# Patient Record
Sex: Female | Born: 1995 | Race: White | Hispanic: No | Marital: Single | Smoking: Current every day smoker
Health system: Southern US, Academic
[De-identification: ages and names within clinical notes are randomized; demographics above are authoritative.]

## PROBLEM LIST (undated history)

## (undated) DIAGNOSIS — D649 Anemia, unspecified: Secondary | ICD-10-CM

## (undated) HISTORY — PX: HX ADENOIDECTOMY: SHX29

## (undated) HISTORY — PX: HX TONSILLECTOMY: SHX27

---
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
## 2015-07-15 ENCOUNTER — Inpatient Hospital Stay (EMERGENCY_DEPARTMENT_HOSPITAL)
Admission: EM | Admit: 2015-07-15 | Discharge: 2015-07-15 | Disposition: A | Discharge status: Home / Self Care | Payer: Self-pay

## 2015-07-15 DIAGNOSIS — R1110 Vomiting, unspecified: Secondary | ICD-10-CM

## 2015-07-15 DIAGNOSIS — K529 Noninfective gastroenteritis and colitis, unspecified: Secondary | ICD-10-CM

## 2015-12-09 ENCOUNTER — Emergency Department (HOSPITAL_COMMUNITY)
Admission: EM | Admit: 2015-12-09 | Discharge: 2015-12-10 | Disposition: A | Discharge status: Home / Self Care | Payer: Self-pay | Attending: Emergency Medicine | Admitting: Emergency Medicine

## 2015-12-09 ENCOUNTER — Encounter (HOSPITAL_COMMUNITY): Payer: Self-pay

## 2015-12-09 DIAGNOSIS — Z3A01 Less than 8 weeks gestation of pregnancy: Secondary | ICD-10-CM | POA: Insufficient documentation

## 2015-12-09 DIAGNOSIS — Z87891 Personal history of nicotine dependence: Secondary | ICD-10-CM | POA: Insufficient documentation

## 2015-12-09 DIAGNOSIS — O209 Hemorrhage in early pregnancy, unspecified: Secondary | ICD-10-CM

## 2015-12-09 DIAGNOSIS — R58 Hemorrhage, not elsewhere classified: Secondary | ICD-10-CM

## 2015-12-09 DIAGNOSIS — O26851 Spotting complicating pregnancy, first trimester: Secondary | ICD-10-CM | POA: Insufficient documentation

## 2015-12-09 DIAGNOSIS — O200 Threatened abortion: Secondary | ICD-10-CM

## 2015-12-09 HISTORY — DX: Anemia, unspecified: D64.9

## 2015-12-09 LAB — POC URINE PREG, ED: PREG TEST UR: POSITIVE — AB

## 2015-12-09 NOTE — ED Provider Notes (Signed)
[facility]-EMERGENCY DEPT Provider Note   CSN: [id number] Arrival date & time: 12/09/15  1949  By signing my name below, I, Emmanuella Mensah, attest that this documentation has been prepared under the direction and in the presence of Shon Baton, MD. Electronically Signed: Angelene Giovanni, ED Scribe. 12/09/15. 11:31 PM.    History   Chief Complaint Chief Complaint  Patient presents with  . Abdominal Pain  . Other    [redacted] weeks pregnant   HPI Comments: Katelyn Rivas is a 20 y.o. female who is [redacted] weeks pregnant who presents to the Emergency Department complaining of gradually worsening moderate vaginal bleeding onset 3 days ago. She reports associated upper abdominal cramping onset today. No alleviating factors noted. Pt has not tried any medications PTA. She reports that this is her 3rd pregnancy and that these episodes are consistent with her menstrual bleeding, just slightly heavier. Her LNMP was October 27, 2015. She denies any n/v/d, urinary symptoms, fever, or chills. G3P2.  No current OB GYN, pt recently moved to town.   The history is provided by the patient. No language interpreter was used.    Past Medical History:  Diagnosis Date  . Anemia     There are no active problems to display for this patient.   History reviewed. No pertinent surgical history.  OB History    Gravida Para Term Preterm AB Living   1             SAB TAB Ectopic Multiple Live Births                   Home Medications    Prior to Admission medications   Not on File    Family History History reviewed. No pertinent family history.  Social History Social History  Substance Use Topics  . Smoking status: Former Smoker    Quit date: 11/29/2015  . Smokeless tobacco: Never Used  . Alcohol use No     Allergies   Review of patient's allergies indicates no known allergies.   Review of Systems Review of Systems  Constitutional: Negative for chills and fever.  Gastrointestinal:  Positive for abdominal pain. Negative for diarrhea, nausea and vomiting.  Genitourinary: Positive for vaginal bleeding. Negative for dysuria and frequency.  All other systems reviewed and are negative.    Physical Exam Updated Vital Signs BP 101/64   Pulse 72   Temp 99.2 F (37.3 C) (Oral)   Resp 12   SpO2 100%   Physical Exam  Constitutional: She is oriented to person, place, and time. She appears well-developed and well-nourished. No distress.  HENT:  Head: Normocephalic and atraumatic.  Cardiovascular: Normal rate, regular rhythm and normal heart sounds.   Pulmonary/Chest: Effort normal and breath sounds normal. No respiratory distress. She has no wheezes.  Abdominal: Soft. Bowel sounds are normal. She exhibits no mass. There is no tenderness. There is no guarding.  Genitourinary:  Genitourinary Comments: Scant blood in the vaginal vault, no active bleeding noted, no clots noted, cervical os is closed, no adnexal tenderness  Neurological: She is alert and oriented to person, place, and time.  Skin: Skin is warm and dry.  Psychiatric: She has a normal mood and affect.  Nursing note and vitals reviewed.    ED Treatments / Results  DIAGNOSTIC STUDIES: Oxygen Saturation is 98% on RA, normal by my interpretation.    COORDINATION OF CARE: 11:27 PM- Pt advised of plan for treatment and pt agrees. Pt will receive lab  work for further evaluation.    Labs (all labs ordered are listed, but only abnormal results are displayed) Labs Reviewed  WET PREP, GENITAL - Abnormal; Notable for the following:       Result Value   Clue Cells Wet Prep HPF POC PRESENT (*)    WBC, Wet Prep HPF POC FEW (*)    All other components within normal limits  URINALYSIS, ROUTINE W REFLEX MICROSCOPIC (NOT AT [facility]) - Abnormal; Notable for the following:    APPearance TURBID (*)    Specific Gravity, Urine 1.033 (*)    Hgb urine dipstick LARGE (*)    Bilirubin Urine SMALL (*)    Ketones, ur 15 (*)     All other components within normal limits  URINE MICROSCOPIC-ADD ON - Abnormal; Notable for the following:    Squamous Epithelial / LPF 0-5 (*)    Bacteria, UA RARE (*)    All other components within normal limits  I-STAT BETA HCG BLOOD, ED ([facility], [facility], [facility] ONLY) - Abnormal; Notable for the following:    I-stat hCG, quantitative >2,000.0 (*)    All other components within normal limits  POC URINE PREG, ED - Abnormal; Notable for the following:    Preg Test, Ur POSITIVE (*)    All other components within normal limits  TYPE AND SCREEN  ABO/RH  GC/CHLAMYDIA PROBE AMP ([place]) NOT AT [facility]    EKG  EKG Interpretation None       Radiology [place] Ob Comp Less 14 Wks  Result Date: 12/10/2015 CLINICAL DATA:  Pregnant patient in first-trimester pregnancy with vaginal bleeding for 3 days. EXAM: OBSTETRIC <14 WK US AND TRANSVAGINAL OB US TECHNIQUE: Both transabdominal and transvaginal ultrasound examinations were performed for complete evaluation of the gestation as well as the maternal uterus, adnexal regions, and pelvic cul-de-sac. Transvaginal technique was performed to assess early pregnancy. COMPARISON:  None. FINDINGS: Intrauterine gestational sac: Single Yolk sac:  Not visualized. Embryo:  Not visualized. MSD: 10  mm   5 w   5  d Subchorionic hemorrhage:  None visualized. Maternal uterus/adnexae: The left ovary measures 2.7 x 1.9 x 2.6 cm and is unremarkable. Right ovary measures 2.2 x 2.3 x 1.7 cm and is unremarkable. No pelvic free fluid. IMPRESSION: Probable early intrauterine gestational sac, but no yolk sac, fetal pole, or cardiac activity yet visualized. Recommend follow-up quantitative B-HCG levels and follow-up US in 14 days to confirm and assess viability. This recommendation follows SRU consensus guidelines: Diagnostic Criteria for Nonviable Pregnancy Early in the First Trimester. Malva Limes Engl J Med 2013; 409:8119-14; 369:1443-51. Electronically Signed   By: Rubye OaksMelanie  Ehinger M.D.   On: 12/10/2015 01:55    [place] Ob Transvaginal  Result Date: 12/10/2015 CLINICAL DATA:  Pregnant patient in first-trimester pregnancy with vaginal bleeding for 3 days. EXAM: OBSTETRIC <14 WK US AND TRANSVAGINAL OB US TECHNIQUE: Both transabdominal and transvaginal ultrasound examinations were performed for complete evaluation of the gestation as well as the maternal uterus, adnexal regions, and pelvic cul-de-sac. Transvaginal technique was performed to assess early pregnancy. COMPARISON:  None. FINDINGS: Intrauterine gestational sac: Single Yolk sac:  Not visualized. Embryo:  Not visualized. MSD: 10  mm   5 w   5  d Subchorionic hemorrhage:  None visualized. Maternal uterus/adnexae: The left ovary measures 2.7 x 1.9 x 2.6 cm and is unremarkable. Right ovary measures 2.2 x 2.3 x 1.7 cm and is unremarkable. No pelvic free fluid. IMPRESSION: Probable early intrauterine gestational sac, but no yolk sac,  fetal pole, or cardiac activity yet visualized. Recommend follow-up quantitative B-HCG levels and follow-up US in 14 days to confirm and assess viability. This recommendation follows SRU consensus guidelines: Diagnostic Criteria for Nonviable Pregnancy Early in the First Trimester. Malva Limes Engl J Med 2013; 161:0960-45; 369:1443-51. Electronically Signed   By: Rubye OaksMelanie  Ehinger M.D.   On: 12/10/2015 01:55    Procedures Procedures (including critical care time)  Medications Ordered in ED Medications  HYDROcodone-acetaminophen (NORCO/VICODIN) 5-325 MG per tablet 1 tablet (1 tablet Oral Given 12/10/15 0058)     Initial Impression / Assessment and Plan / ED Course  Shon Batonourtney F Tanasha Menees, MD has reviewed the triage vital signs and the nursing notes.  Pertinent labs & imaging results that were available during my care of the patient were reviewed by me and considered in my medical decision making (see chart for details).  Clinical Course    Patient presents with bleeding during early pregnancy. Cervical os is closed. She is otherwise nontoxic. Ultrasound  obtained and shows a gestational sac suggestive of early pregnancy 5 weeks and 5 days. Based on last menstrual period she would be 6 weeks and 2 days. Discussed with the patient that given bleeding, would diagnose her with a threatened miscarriage. She needs a repeat ultrasound and beta hCG in 2 weeks to assess for viability of pregnancy. She was given bleeding precautions and follow-up in women's clinic.  After history, exam, and medical workup I feel the patient has been appropriately medically screened and is safe for discharge home. Pertinent diagnoses were discussed with the patient. Patient was given return precautions.   Final Clinical Impressions(s) / ED Diagnoses   Final diagnoses:  Bleeding in early pregnancy  Threatened miscarriage    New Prescriptions New Prescriptions   No medications on file   I personally performed the services described in this documentation, which was scribed in my presence. The recorded information has been reviewed and is accurate.    Shon Batonourtney F Kiri Hinderliter, MD 12/10/15 [phone]

## 2015-12-09 NOTE — ED Triage Notes (Signed)
Pt states is [redacted]weeks pregnant. Began bleeding 3 days ago. Pt states marked increase in bleeding today. States ~ 1 pad/hr. 10 pads total. Pt also complaining of upper abdominal pain/cramping.

## 2015-12-09 NOTE — ED Notes (Signed)
Patient ambulatory to restroom with steady gait. Denies dizziness/lighheadedness.

## 2015-12-09 NOTE — ED Notes (Signed)
Pelvic cart set up at bedside  

## 2015-12-10 ENCOUNTER — Emergency Department (HOSPITAL_COMMUNITY): Payer: Self-pay

## 2015-12-10 LAB — TYPE AND SCREEN
ABO/RH(D): O POS
ANTIBODY SCREEN: NEGATIVE

## 2015-12-10 LAB — URINALYSIS, ROUTINE W REFLEX MICROSCOPIC
GLUCOSE, UA: NEGATIVE mg/dL
Ketones, ur: 15 mg/dL — AB
LEUKOCYTES UA: NEGATIVE
NITRITE: NEGATIVE
PH: 5.5 (ref 5.0–8.0)
Protein, ur: NEGATIVE mg/dL
Specific Gravity, Urine: 1.033 — ABNORMAL HIGH (ref 1.005–1.030)

## 2015-12-10 LAB — WET PREP, GENITAL
Sperm: NONE SEEN
Trich, Wet Prep: NONE SEEN
Yeast Wet Prep HPF POC: NONE SEEN

## 2015-12-10 LAB — GC/CHLAMYDIA PROBE AMP (~~LOC~~) NOT AT ARMC
CHLAMYDIA, DNA PROBE: NEGATIVE
Neisseria Gonorrhea: NEGATIVE

## 2015-12-10 LAB — URINE MICROSCOPIC-ADD ON: WBC UA: NONE SEEN WBC/hpf (ref 0–5)

## 2015-12-10 LAB — I-STAT BETA HCG BLOOD, ED (MC, WL, AP ONLY)

## 2015-12-10 LAB — ABO/RH: ABO/RH(D): O POS

## 2015-12-10 MED ORDER — HYDROCODONE-ACETAMINOPHEN 5-325 MG PO TABS
1.0000 | ORAL_TABLET | Freq: Once | ORAL | Status: AC
  Administered 2015-12-10: 1 via ORAL
  Filled 2015-12-10: qty 1

## 2015-12-10 NOTE — ED Notes (Signed)
Patient transported to US 

## 2015-12-10 NOTE — ED Notes (Signed)
Patient verbalized understanding of discharge instructions and denies any further needs or questions at this time. VS stable. Patient ambulatory with steady gait.  

## 2016-04-27 LAB — RUBELLA VIRUS IGG AB: RUBELLA IGG: IMMUNE

## 2016-04-27 LAB — HIV1/HIV2 SCREEN, COMBINED ANTIGEN AND ANTIBODY: HIV SCREEN, COMBINED ANTIGEN & ANTIBODY: NEGATIVE

## 2016-04-27 LAB — SERUM SYPHILIS TEST: RPR: NONREACTIVE

## 2016-04-27 LAB — HEPATITIS B SURFACE ANTIGEN: HEPATITIS B SURFACE AG: NEGATIVE

## 2016-09-24 IMAGING — US US OB TRANSVAGINAL
1 series · 14 of 28 positions shown · non-contrast
Comparison: None.

CLINICAL DATA: Pregnant patient in first-trimester pregnancy with
vaginal bleeding for 3 days.

EXAM:
OBSTETRIC <14 WK US AND TRANSVAGINAL OB US
TECHNIQUE: Both transabdominal and transvaginal ultrasound examinations were
performed for complete evaluation of the gestation as well as the
maternal uterus, adnexal regions, and pelvic cul-de-sac.
Transvaginal technique was performed to assess early pregnancy.

[Series 1: us ob transvaginal · 0.17mm/px · 45 acquisitions, 14 frames shown]
[im 2/45]
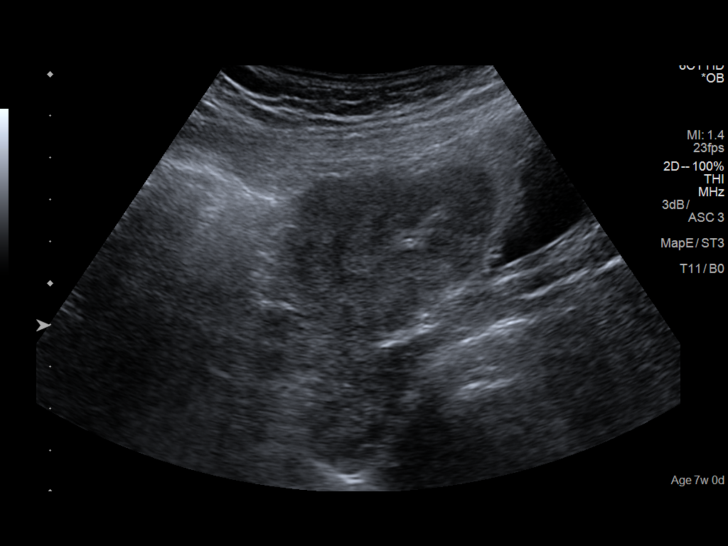
[im 5/45]
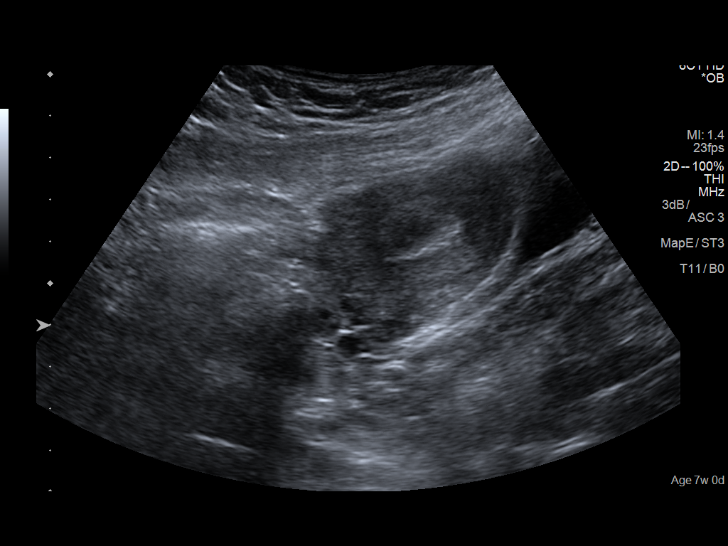
[im 9/45]
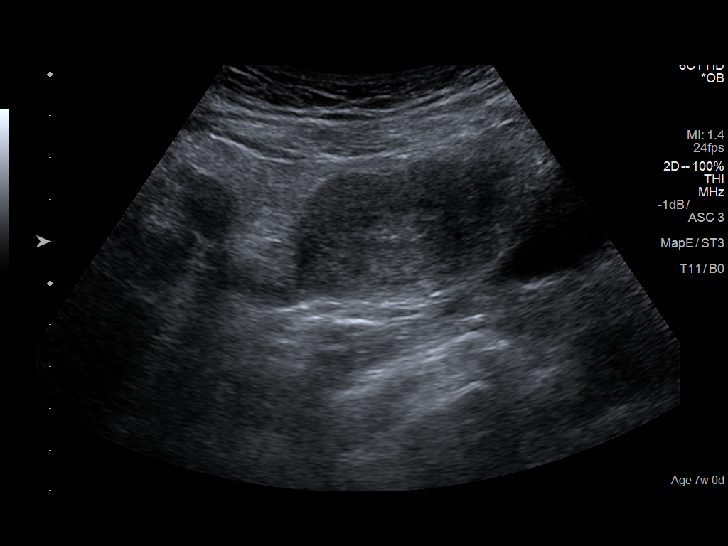
[im 12/45]
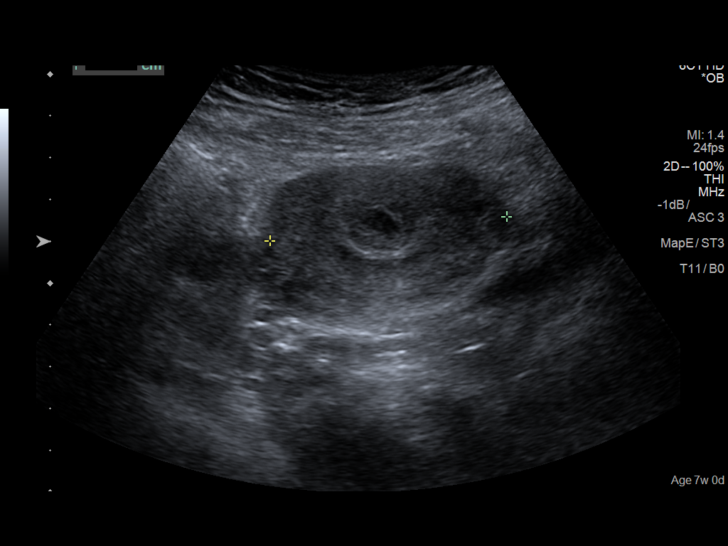
[im 15/45]
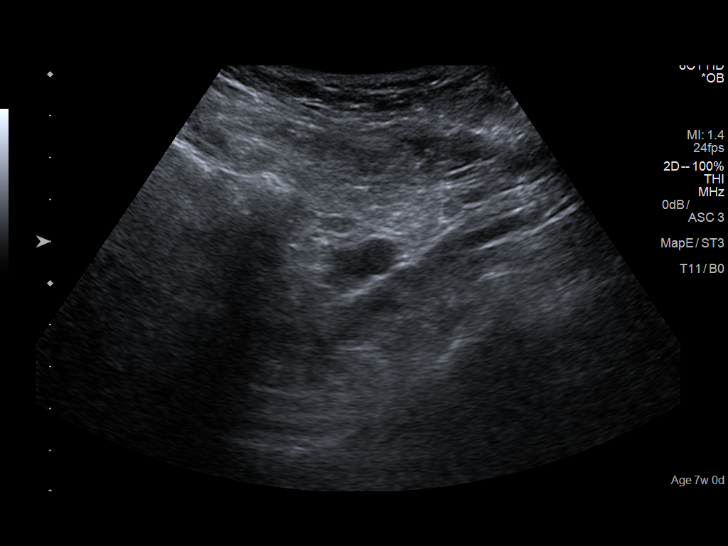
[im 18/45]
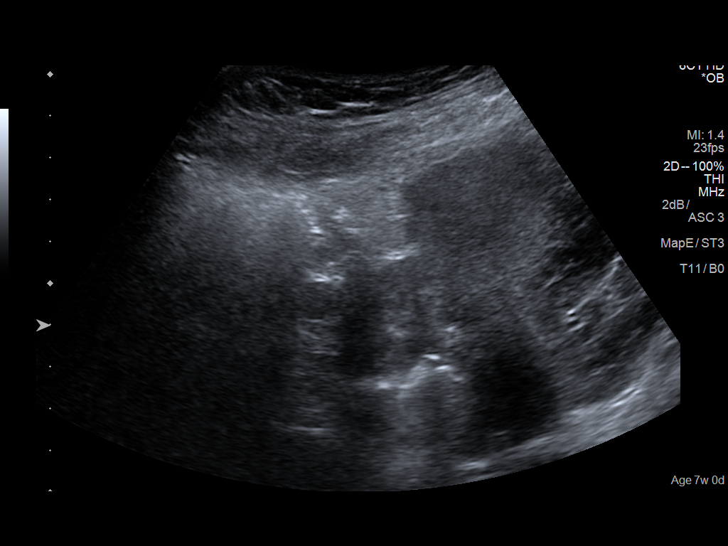
[im 22/45]
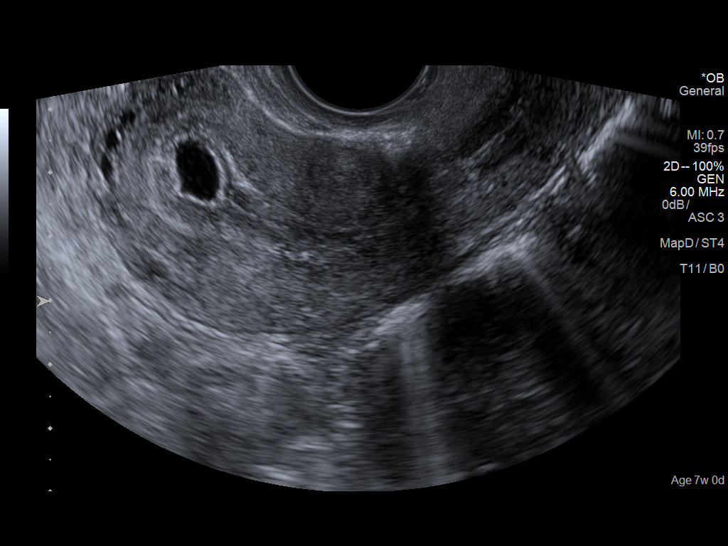
[im 25/45]
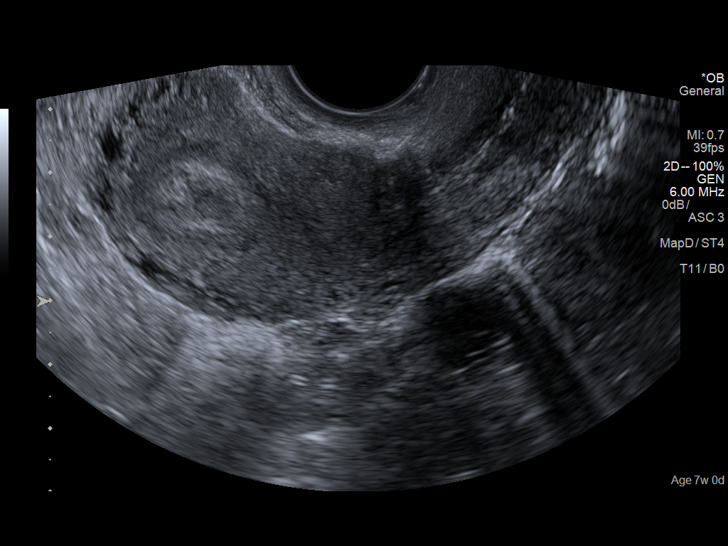
[im 28/45]
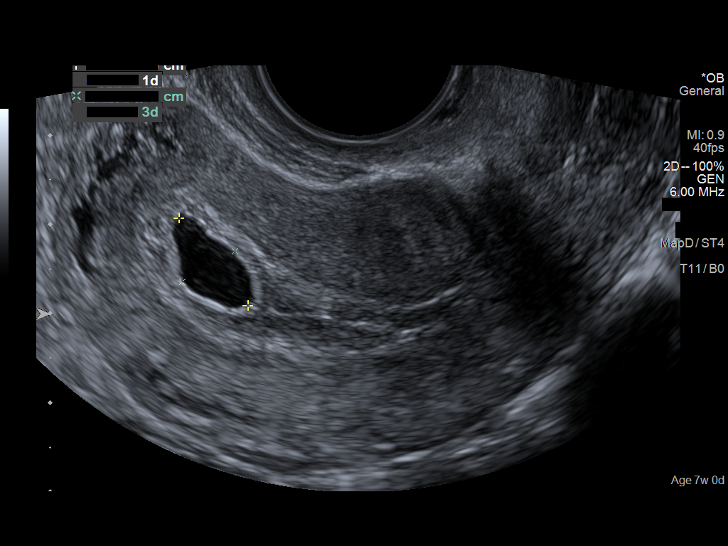
[im 31/45]
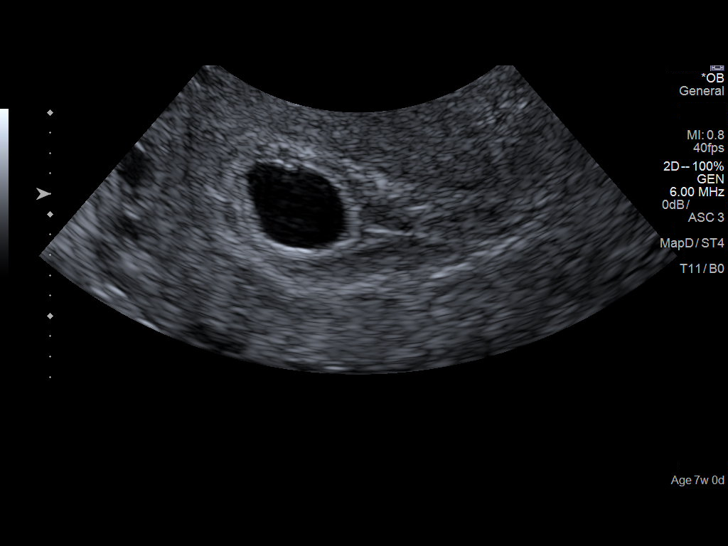
[im 35/45]
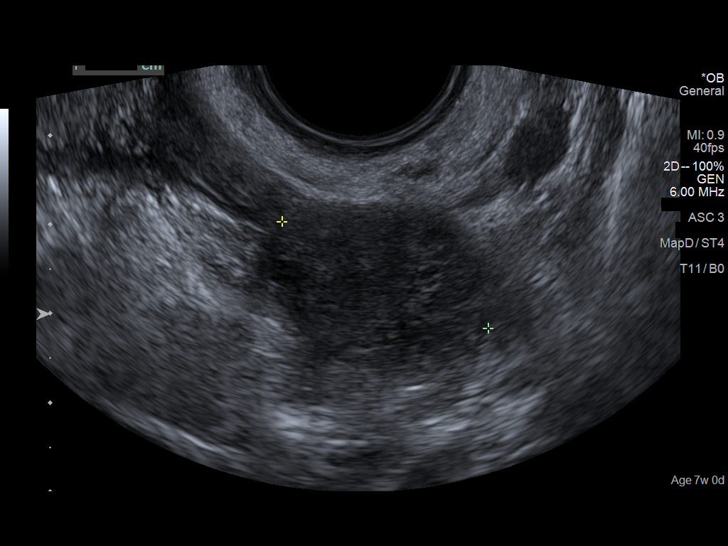
[im 38/45]
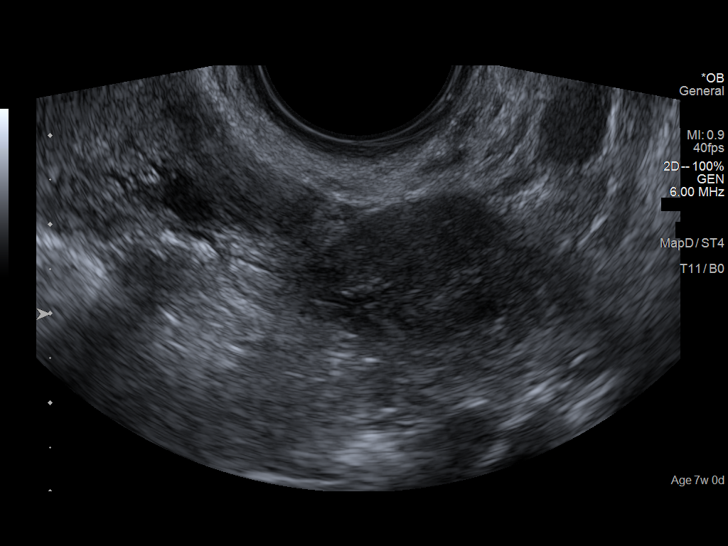
[im 41/45]
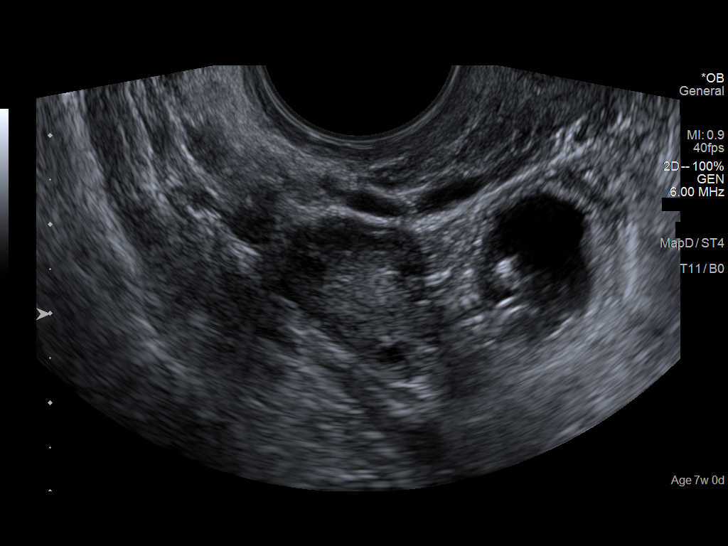
[im 45/45]
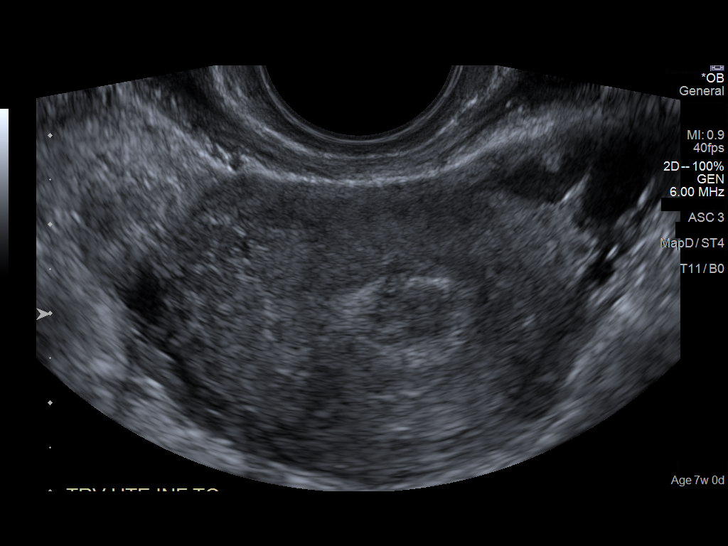

[14 of 28 positions shown; findings below may reference images not displayed]

FINDINGS: Intrauterine gestational sac: Single

Yolk sac:  Not visualized.

Embryo:  Not visualized.

MSD: 10  mm   5 w   5  d

Subchorionic hemorrhage:  None visualized.

Maternal uterus/adnexae: The left ovary measures 2.7 x 1.9 x 2.6 cm
and is unremarkable. Right ovary measures 2.2 x 2.3 x 1.7 cm and is
unremarkable. No pelvic free fluid.
IMPRESSION: Probable early intrauterine gestational sac, but no yolk sac, fetal
pole, or cardiac activity yet visualized. Recommend follow-up
quantitative B-HCG levels and follow-up US in 14 days to confirm and
assess viability. This recommendation follows SRU consensus
guidelines: Diagnostic Criteria for Nonviable Pregnancy Early in the
First Trimester. N Engl J Med 4857; [DATE].

## 2016-10-05 ENCOUNTER — Ambulatory Visit
Admission: RE | Admit: 2016-10-05 | Discharge: 2016-10-05 | Disposition: A | Discharge status: Home / Self Care | Payer: Medicaid Other | Attending: Specialist | Admitting: Specialist

## 2016-10-05 ENCOUNTER — Encounter (HOSPITAL_COMMUNITY): Payer: Self-pay

## 2016-10-05 ENCOUNTER — Ambulatory Visit (HOSPITAL_COMMUNITY): Payer: Self-pay | Admitting: Obstetrics & Gynecology

## 2016-10-05 DIAGNOSIS — Z3493 Encounter for supervision of normal pregnancy, unspecified, third trimester: Secondary | ICD-10-CM | POA: Insufficient documentation

## 2016-10-05 LAB — URINALYSIS, MACRO/MICRO
BILIRUBIN: NOT DETECTED mg/dL
BLOOD: NOT DETECTED mg/dL
GLUCOSE: NOT DETECTED mg/dL
KETONES: NOT DETECTED mg/dL
NITRITE: NOT DETECTED
PH: 7 (ref 5.0–8.0)
PROTEIN: NOT DETECTED mg/dL
SPECIFIC GRAVITY: 1.002 — ABNORMAL LOW (ref 1.005–1.030)
UROBILINOGEN: NOT DETECTED mg/dL

## 2016-10-05 LAB — URINALYSIS, MICROSCOPIC

## 2016-10-05 NOTE — Nurses Notes (Signed)
Patient discharged home with family.  AVS reviewed with patient/care giver.  A written copy of the AVS and discharge instructions was given to the patient/care giver.  Questions sufficiently answered as needed.  Patient/care giver encouraged to follow up with PCP as indicated.  In the event of an emergency, patient/care giver instructed to call 911 or go to the nearest emergency room.

## 2016-10-05 NOTE — Discharge Instructions (Signed)
Return to hospital if water breaks, decreased fetal movement, vaginal bleeding: may have some spotting after vaginal exam or intercourse.  More than 6 contractions in one hour until you ar [redacted] weeks along and then after 36 weeks if you are having contractions that are 5 minutes apart and becoming closer and stronger.  Severe headache unrelieved with Tylenol.  Blurred or double vision, spot or floaters in field of vision.  Drink 8-10 glasses of water daily.

## 2016-10-05 NOTE — Nurses Notes (Signed)
Pt to triage bed #3 from ER complaining of vaginal pressure since Saturday.  Denies any vaginal bleeding or leaking of fluid.  Confirms fetal movement.  Plan of care discussed, verbalized understanding and consent.  Change into gown.

## 2016-10-05 NOTE — Progress Notes (Signed)
Patient presents to labor and delivery triage with complaints of pelvic pressure.  She is approximately [redacted] weeks gestation.  Cervix is noted to be closed and thick.  Patient is scheduled for repeat C-section.  Nonstress test was reassuring with good accelerations and variability noted. Patient is scheduled to see her primary OB this week.  She was given instructions to return to the hospital if necessary or to keep her appointment as scheduled.

## 2016-10-07 LAB — URINE CULTURE,ROUTINE: URINE CULTURE: <10000

## 2016-11-09 ENCOUNTER — Inpatient Hospital Stay (HOSPITAL_COMMUNITY): Payer: Medicaid Other | Admitting: Certified Registered"

## 2016-11-09 ENCOUNTER — Encounter (HOSPITAL_COMMUNITY)
Admission: RE | Disposition: A | Discharge status: Home / Self Care | Payer: Self-pay | Source: Ambulatory Visit | Attending: Specialist

## 2016-11-09 ENCOUNTER — Inpatient Hospital Stay (HOSPITAL_COMMUNITY): Payer: Medicaid Other | Admitting: Specialist

## 2016-11-09 ENCOUNTER — Encounter (HOSPITAL_COMMUNITY): Payer: Self-pay

## 2016-11-09 ENCOUNTER — Other Ambulatory Visit (HOSPITAL_COMMUNITY): Payer: Self-pay | Admitting: Specialist

## 2016-11-09 ENCOUNTER — Inpatient Hospital Stay
Admission: RE | Admit: 2016-11-09 | Discharge: 2016-11-11 | DRG: 766 | Disposition: A | Discharge status: Home / Self Care | Payer: Medicaid Other | Source: Ambulatory Visit | Attending: Specialist | Admitting: Specialist

## 2016-11-09 DIAGNOSIS — Z302 Encounter for sterilization: Secondary | ICD-10-CM

## 2016-11-09 DIAGNOSIS — O99334 Smoking (tobacco) complicating childbirth: Secondary | ICD-10-CM | POA: Diagnosis present

## 2016-11-09 DIAGNOSIS — Z3490 Encounter for supervision of normal pregnancy, unspecified, unspecified trimester: Secondary | ICD-10-CM | POA: Diagnosis present

## 2016-11-09 DIAGNOSIS — F17210 Nicotine dependence, cigarettes, uncomplicated: Secondary | ICD-10-CM | POA: Diagnosis present

## 2016-11-09 DIAGNOSIS — O34211 Maternal care for low transverse scar from previous cesarean delivery: Principal | ICD-10-CM | POA: Diagnosis present

## 2016-11-09 DIAGNOSIS — Z3A39 39 weeks gestation of pregnancy: Secondary | ICD-10-CM

## 2016-11-09 LAB — TYPE AND SCREEN
ABO/RH(D): O POS
ANTIBODY SCREEN: NEGATIVE

## 2016-11-09 LAB — URINALYSIS, MACROSCOPIC
BILIRUBIN: NOT DETECTED mg/dL
BLOOD: NOT DETECTED mg/dL
GLUCOSE: NOT DETECTED mg/dL
KETONES: NOT DETECTED mg/dL
LEUKOCYTES: NOT DETECTED WBCs/uL
NITRITE: NOT DETECTED
PH: 6 (ref 5.0–8.0)
PROTEIN: NOT DETECTED mg/dL
SPECIFIC GRAVITY: 1.014 (ref 1.005–1.030)
UROBILINOGEN: NOT DETECTED mg/dL

## 2016-11-09 LAB — URINE DRUG SCREEN
AMPHETAMINES URINE: NOT DETECTED
BARBITURATES URINE: NOT DETECTED
BENZODIAZEPINES URINE: NOT DETECTED
BUPRENORPHINE URINE: NOT DETECTED
CANNABINOIDS URINE: NOT DETECTED
COCAINE METABOLITES URINE: NOT DETECTED
ECSTASY/MDMA URINE: NOT DETECTED
METHADONE URINE: NOT DETECTED
METHAMPHETAMINES URINE: NOT DETECTED
OPIATES URINE: NOT DETECTED
OXYCODONE URINE: NOT DETECTED
PCP URINE: NOT DETECTED
PROPOXYPHENE URINE: NOT DETECTED

## 2016-11-09 LAB — CBC
HCT: 29.3 % — ABNORMAL LOW (ref 37.0–47.0)
HCT: 29.3 % — ABNORMAL LOW (ref 37.0–47.0)
HGB: 9.6 g/dL — ABNORMAL LOW (ref 12.0–16.0)
MCH: 25.6 pg — ABNORMAL LOW (ref 27.0–31.0)
MCHC: 32.8 g/dL — ABNORMAL LOW (ref 33.0–37.0)
MCV: 78 fL — ABNORMAL LOW (ref 80.0–99.0)
PLATELETS: 304 x10ˆ3/uL (ref 130–400)
RBC: 3.76 x10ˆ6/uL — ABNORMAL LOW (ref 4.20–5.40)
RDW: 16.8 % — ABNORMAL HIGH (ref 11.5–14.5)
WBC: 13.2 x10ˆ3/uL — ABNORMAL HIGH (ref 4.8–10.8)

## 2016-11-09 SURGERY — Surgical Case
Anesthesia: Epidural | Site: Abdomen | Wound class: Clean Contaminated Wounds-The respiratory, GI, Genital, or urinary

## 2016-11-09 SURGERY — Surgical Case
Anesthesia: *Unknown

## 2016-11-09 MED ORDER — METHYLERGONOVINE 0.2 MG/ML (1 ML) INJECTION SOLUTION
0.2000 mg | Freq: Once | INTRAMUSCULAR | Status: DC | PRN
Start: 2016-11-09 — End: 2016-11-11

## 2016-11-09 MED ORDER — LANOLIN TOPICAL CREAM
TOPICAL_CREAM | CUTANEOUS | Status: DC | PRN
Start: 2016-11-09 — End: 2016-11-11

## 2016-11-09 MED ORDER — HYDROMORPHONE 0.5 MG/0.5 ML INJECTION SYRINGE
INJECTION | INTRAMUSCULAR | Status: AC
Start: 2016-11-09 — End: 2016-11-09
  Filled 2016-11-09: qty 0.5

## 2016-11-09 MED ORDER — BUPIVACAINE (PF) 0.75 % (7.5 MG/ML) IN 8.25 % DEXTROSE INJECTION
INTRAMUSCULAR | Status: AC
Start: 2016-11-09 — End: 2016-11-09
  Filled 2016-11-09: qty 2

## 2016-11-09 MED ORDER — OXYTOCIN 10 UNIT/ML INJECTION SOLUTION
INTRAMUSCULAR | Status: AC
Start: 2016-11-09 — End: 2016-11-09
  Filled 2016-11-09: qty 1

## 2016-11-09 MED ORDER — ZOLPIDEM 5 MG TABLET
5.0000 mg | ORAL_TABLET | Freq: Every evening | ORAL | Status: DC | PRN
Start: 2016-11-09 — End: 2016-11-11

## 2016-11-09 MED ORDER — ONDANSETRON 4 MG DISINTEGRATING TABLET
4.0000 mg | ORAL_TABLET | Freq: Four times a day (QID) | ORAL | Status: DC | PRN
Start: 2016-11-09 — End: 2016-11-11
  Administered 2016-11-10: 4 mg via ORAL
  Filled 2016-11-09: qty 1

## 2016-11-09 MED ORDER — CEFOXITIN 1 GRAM INTRAVENOUS SOLUTION
Freq: Once | INTRAVENOUS | Status: DC | PRN
Start: 2016-11-09 — End: 2016-11-09
  Administered 2016-11-09: 2000 mg via INTRAVENOUS

## 2016-11-09 MED ORDER — KETOROLAC 30 MG/ML (1 ML) INJECTION SOLUTION
INTRAMUSCULAR | Status: AC
Start: 2016-11-09 — End: 2016-11-09
  Filled 2016-11-09: qty 1

## 2016-11-09 MED ORDER — MIDAZOLAM 1 MG/ML INJECTION SOLUTION
Freq: Once | INTRAMUSCULAR | Status: DC | PRN
Start: 2016-11-09 — End: 2016-11-09
  Administered 2016-11-09: 2 mg via INTRAVENOUS

## 2016-11-09 MED ORDER — MEPERIDINE (PF) 50 MG/ML INJECTION SOLUTION
25.0000 mg | INTRAMUSCULAR | Status: DC | PRN
Start: 2016-11-09 — End: 2016-11-09

## 2016-11-09 MED ORDER — OXYTOCIN 10 UNIT/ML INJECTION SOLUTION
INTRAVENOUS | Status: AC
Start: 2016-11-09 — End: 2016-11-10
  Filled 2016-11-09 (×3): qty 3

## 2016-11-09 MED ORDER — LACTATED RINGERS INTRAVENOUS SOLUTION
INTRAVENOUS | Status: DC
Start: 2016-11-09 — End: 2016-11-11

## 2016-11-09 MED ORDER — IBUPROFEN 800 MG TABLET
800.0000 mg | ORAL_TABLET | Freq: Three times a day (TID) | ORAL | Status: DC | PRN
Start: 2016-11-09 — End: 2016-11-11
  Administered 2016-11-10 – 2016-11-11 (×2): 800 mg via ORAL
  Filled 2016-11-09 (×4): qty 1

## 2016-11-09 MED ORDER — ONDANSETRON HCL (PF) 4 MG/2 ML INJECTION SOLUTION
4.0000 mg | Freq: Once | INTRAMUSCULAR | Status: DC | PRN
Start: 2016-11-09 — End: 2016-11-09

## 2016-11-09 MED ORDER — SODIUM CHLORIDE 0.9 % (FLUSH) INJECTION SYRINGE
3.0000 mL | INJECTION | Freq: Three times a day (TID) | INTRAMUSCULAR | Status: DC
Start: 2016-11-09 — End: 2016-11-11
  Administered 2016-11-09 (×2): 0 mL
  Administered 2016-11-10: 3 mL
  Administered 2016-11-10: 0 mL
  Administered 2016-11-11: 3 mL

## 2016-11-09 MED ORDER — LACTATED RINGERS INTRAVENOUS SOLUTION
INTRAVENOUS | Status: DC | PRN
Start: 2016-11-09 — End: 2016-11-09

## 2016-11-09 MED ORDER — ACETAMINOPHEN 325 MG TABLET
650.0000 mg | ORAL_TABLET | Freq: Four times a day (QID) | ORAL | Status: DC | PRN
Start: 2016-11-09 — End: 2016-11-11

## 2016-11-09 MED ORDER — OXYCODONE-ACETAMINOPHEN 5 MG-325 MG TABLET
1.0000 | ORAL_TABLET | ORAL | Status: DC | PRN
Start: 2016-11-09 — End: 2016-11-11
  Administered 2016-11-10: 1 via ORAL
  Filled 2016-11-09: qty 1

## 2016-11-09 MED ORDER — OXYCODONE-ACETAMINOPHEN 10 MG-325 MG TABLET
1.0000 | ORAL_TABLET | ORAL | Status: DC | PRN
Start: 2016-11-09 — End: 2016-11-11
  Administered 2016-11-10 – 2016-11-11 (×7): 1 via ORAL
  Filled 2016-11-09 (×7): qty 1

## 2016-11-09 MED ORDER — FENTANYL (PF) 50 MCG/ML INJECTION SOLUTION
INTRAMUSCULAR | Status: AC
Start: 2016-11-09 — End: 2016-11-09
  Filled 2016-11-09: qty 2

## 2016-11-09 MED ORDER — HYDROMORPHONE 0.5 MG/0.5 ML INJECTION SYRINGE
0.5000 mg | INJECTION | INTRAMUSCULAR | Status: DC | PRN
Start: 2016-11-09 — End: 2016-11-09
  Administered 2016-11-09: 0.5 mg via INTRAVENOUS

## 2016-11-09 MED ORDER — KETOROLAC 30 MG/ML (1 ML) INJECTION SOLUTION
Freq: Once | INTRAMUSCULAR | Status: DC | PRN
Start: 2016-11-09 — End: 2016-11-09
  Administered 2016-11-09: 30 mg via INTRAVENOUS

## 2016-11-09 MED ORDER — LACTATED RINGERS IV BOLUS
1000.0000 mL | INJECTION | Freq: Once | Status: DC
Start: 2016-11-09 — End: 2016-11-09

## 2016-11-09 MED ORDER — FENTANYL (PF) 50 MCG/ML INJECTION SOLUTION
Freq: Once | INTRAMUSCULAR | Status: DC | PRN
Start: 2016-11-09 — End: 2016-11-09
  Administered 2016-11-09: 25 ug via INTRAVENOUS
  Administered 2016-11-09: 100 ug via INTRAVENOUS
  Administered 2016-11-09: 75 ug via INTRAVENOUS

## 2016-11-09 MED ORDER — OXYTOCIN 10 UNIT/ML INJECTION SOLUTION
INTRAMUSCULAR | Status: AC
Start: 2016-11-09 — End: 2016-11-09
  Filled 2016-11-09: qty 2

## 2016-11-09 MED ORDER — BUPIVACAINE (PF) 0.75 % (7.5 MG/ML) IN 8.25 % DEXTROSE INJECTION
Freq: Once | INTRAMUSCULAR | Status: DC | PRN
Start: 2016-11-09 — End: 2016-11-09
  Administered 2016-11-09: 1.6 mL via INTRASPINAL

## 2016-11-09 MED ORDER — SODIUM CITRATE-CITRIC ACID 500 MG-334 MG/5 ML ORAL SOLUTION
30.0000 mL | Freq: Once | ORAL | Status: AC
Start: 2016-11-09 — End: 2016-11-09
  Administered 2016-11-09: 15 mL via ORAL
  Filled 2016-11-09: qty 30

## 2016-11-09 MED ORDER — DIPHENHYDRAMINE 50 MG/ML INJECTION SOLUTION
INTRAMUSCULAR | Status: AC
Start: 2016-11-09 — End: 2016-11-09
  Filled 2016-11-09: qty 1

## 2016-11-09 MED ORDER — DIPHENHYDRAMINE 25 MG CAPSULE
25.0000 mg | ORAL_CAPSULE | Freq: Four times a day (QID) | ORAL | Status: DC | PRN
Start: 2016-11-09 — End: 2016-11-11
  Administered 2016-11-10 (×2): 25 mg via ORAL
  Filled 2016-11-09: qty 1

## 2016-11-09 MED ORDER — ONDANSETRON HCL (PF) 4 MG/2 ML INJECTION SOLUTION
Freq: Once | INTRAMUSCULAR | Status: DC | PRN
Start: 2016-11-09 — End: 2016-11-09
  Administered 2016-11-09: 4 mg via INTRAVENOUS

## 2016-11-09 MED ORDER — PROPOFOL 10 MG/ML IV BOLUS
INJECTION | Freq: Once | INTRAVENOUS | Status: DC | PRN
Start: 2016-11-09 — End: 2016-11-09
  Administered 2016-11-09 (×2): 50 mg via INTRAVENOUS
  Administered 2016-11-09: 25 mg via INTRAVENOUS
  Administered 2016-11-09: 50 mg via INTRAVENOUS
  Administered 2016-11-09: 25 mg via INTRAVENOUS

## 2016-11-09 MED ORDER — PCA - HYDROMORPHONE 0.2 MG/ML IN NS (MAX BOLUSES/HOUR, 1H LIMIT)
INJECTION | INTRAMUSCULAR | Status: DC
Start: 2016-11-09 — End: 2016-11-09

## 2016-11-09 MED ORDER — ONDANSETRON HCL (PF) 4 MG/2 ML INJECTION SOLUTION
4.0000 mg | Freq: Four times a day (QID) | INTRAMUSCULAR | Status: DC | PRN
Start: 2016-11-09 — End: 2016-11-11

## 2016-11-09 MED ORDER — DOCUSATE SODIUM 100 MG CAPSULE
100.0000 mg | ORAL_CAPSULE | Freq: Two times a day (BID) | ORAL | Status: DC | PRN
Start: 2016-11-09 — End: 2016-11-11
  Administered 2016-11-11: 100 mg via ORAL
  Filled 2016-11-09: qty 1

## 2016-11-09 MED ORDER — SODIUM CHLORIDE 0.9 % (FLUSH) INJECTION SYRINGE
3.0000 mL | INJECTION | INTRAMUSCULAR | Status: DC | PRN
Start: 2016-11-09 — End: 2016-11-11

## 2016-11-09 MED ORDER — CEFOXITIN 2G IN NS 50ML MINIBAG IVPB
2.0000 g | Freq: Four times a day (QID) | INTRAVENOUS | Status: AC
Start: 2016-11-09 — End: 2016-11-09
  Administered 2016-11-09: 0 g via INTRAVENOUS
  Administered 2016-11-09 (×2): 2 g via INTRAVENOUS
  Filled 2016-11-09 (×3): qty 50

## 2016-11-09 MED ORDER — METOCLOPRAMIDE 5 MG/ML INJECTION SOLUTION
10.0000 mg | Freq: Four times a day (QID) | INTRAMUSCULAR | Status: DC | PRN
Start: 2016-11-09 — End: 2016-11-11
  Administered 2016-11-09: 10 mg via INTRAVENOUS
  Filled 2016-11-09: qty 2

## 2016-11-09 MED ORDER — BISACODYL 10 MG RECTAL SUPPOSITORY
10.0000 mg | Freq: Once | RECTAL | Status: DC | PRN
Start: 2016-11-09 — End: 2016-11-11

## 2016-11-09 MED ORDER — PCA - HYDROMORPHONE 0.2 MG/ML IN NS (MAX BOLUSES/HOUR, 1H LIMIT)
INJECTION | INTRAMUSCULAR | Status: DC
Start: 2016-11-09 — End: 2016-11-11
  Filled 2016-11-09 (×4): qty 30

## 2016-11-09 MED ORDER — DIPHENHYDRAMINE 50 MG/ML INJECTION SOLUTION
6.5000 mg | Freq: Four times a day (QID) | INTRAMUSCULAR | Status: DC | PRN
Start: 2016-11-09 — End: 2016-11-11
  Administered 2016-11-09: 6.5 mg via INTRAVENOUS
  Filled 2016-11-09: qty 1

## 2016-11-09 MED ORDER — HYDROMORPHONE 2 MG/ML INJECTION SOLUTION
INTRAMUSCULAR | Status: AC
Start: 2016-11-09 — End: 2016-11-09
  Filled 2016-11-09: qty 1

## 2016-11-09 MED ORDER — ACETAMINOPHEN 1,000 MG/100 ML (10 MG/ML) INTRAVENOUS SOLUTION
INTRAVENOUS | Status: AC
Start: 2016-11-09 — End: 2016-11-09
  Filled 2016-11-09: qty 100

## 2016-11-09 MED ORDER — LACTATED RINGERS IV BOLUS
500.0000 mL | INJECTION | Status: DC | PRN
Start: 2016-11-09 — End: 2016-11-11

## 2016-11-09 MED ORDER — CEFOXITIN 2 GRAM INTRAVENOUS SOLUTION
INTRAVENOUS | Status: AC
Start: 2016-11-09 — End: 2016-11-09
  Filled 2016-11-09: qty 20

## 2016-11-09 MED ORDER — CARBOPROST TROMETHAMINE 250 MCG/ML INTRAMUSCULAR SOLUTION
250.0000 ug | Freq: Once | INTRAMUSCULAR | Status: DC | PRN
Start: 2016-11-09 — End: 2016-11-11

## 2016-11-09 MED ORDER — EPHEDRINE (PF) 50 MG/10 ML (5 MG/ML) IN 0.9 % SOD.CHLORIDE IV SYRINGE
INJECTION | INTRAVENOUS | Status: AC
Start: 2016-11-09 — End: 2016-11-09
  Filled 2016-11-09: qty 10

## 2016-11-09 MED ORDER — HYDROCORTISONE 2.5 % TOPICAL CREAM WITH PERINEAL APPLICATOR
TOPICAL_CREAM | Freq: Four times a day (QID) | CUTANEOUS | Status: DC | PRN
Start: 2016-11-09 — End: 2016-11-11

## 2016-11-09 MED ORDER — NALOXONE 0.4 MG/ML INJECTION SOLUTION
0.4000 mg | Freq: Once | INTRAMUSCULAR | Status: DC | PRN
Start: 2016-11-09 — End: 2016-11-11

## 2016-11-09 MED ORDER — HYDROXYZINE HCL 25 MG/ML INTRAMUSCULAR SOLUTION
25.0000 mg | Freq: Four times a day (QID) | INTRAMUSCULAR | Status: DC | PRN
Start: 2016-11-09 — End: 2016-11-11

## 2016-11-09 MED ORDER — FAMOTIDINE (PF) 20 MG/2 ML INTRAVENOUS SOLUTION
20.0000 mg | Freq: Once | INTRAVENOUS | Status: AC
Start: 2016-11-09 — End: 2016-11-09
  Administered 2016-11-09: 20 mg via INTRAVENOUS
  Filled 2016-11-09: qty 2

## 2016-11-09 MED ORDER — MIDAZOLAM 1 MG/ML INJECTION SOLUTION
INTRAMUSCULAR | Status: AC
Start: 2016-11-09 — End: 2016-11-09
  Filled 2016-11-09: qty 2

## 2016-11-09 MED ORDER — ACETAMINOPHEN 1,000 MG/100 ML (10 MG/ML) INTRAVENOUS SOLUTION
Freq: Once | INTRAVENOUS | Status: DC | PRN
Start: 2016-11-09 — End: 2016-11-09
  Administered 2016-11-09: 1000 mg via INTRAVENOUS

## 2016-11-09 MED ORDER — HYDROMORPHONE 2 MG/ML INJECTION SOLUTION
Freq: Once | INTRAMUSCULAR | Status: DC | PRN
Start: 2016-11-09 — End: 2016-11-09
  Administered 2016-11-09: 0.5 mg via INTRAVENOUS
  Administered 2016-11-09: 1 mg via INTRAVENOUS
  Administered 2016-11-09: 0.5 mg via INTRAVENOUS

## 2016-11-09 MED ORDER — SENNOSIDES 8.6 MG-DOCUSATE SODIUM 50 MG TABLET
1.0000 | ORAL_TABLET | Freq: Every evening | ORAL | Status: DC | PRN
Start: 2016-11-09 — End: 2016-11-11

## 2016-11-09 MED ORDER — PROPOFOL 10 MG/ML INTRAVENOUS EMULSION
INTRAVENOUS | Status: AC
Start: 2016-11-09 — End: 2016-11-09
  Filled 2016-11-09: qty 20

## 2016-11-09 MED ADMIN — cefOXitin 1 gram intravenous solution: INTRAVENOUS | @ 11:00:00

## 2016-11-09 MED ADMIN — HYDROmorphone 0.2 mg/mL in 0.9 % sodium chloride intravenous: INTRAVENOUS | @ 17:00:00

## 2016-11-09 MED ADMIN — midazolam 1 mg/mL injection solution: INTRAVENOUS | @ 12:00:00

## 2016-11-09 MED ADMIN — acetaminophen 1,000 mg/100 mL (10 mg/mL) intravenous solution: INTRAVENOUS | @ 11:00:00

## 2016-11-09 MED ADMIN — DEXTROSE VARIABLE W/ ELECTROLYTE-A W/ WO ADDITIVES: INTRAVENOUS | @ 12:00:00

## 2016-11-09 MED ADMIN — HYDROmorphone 2 mg/mL injection solution: INTRAVENOUS | @ 11:00:00

## 2016-11-09 MED ADMIN — dextrose 5 % in water (D5W) intravenous solution: ORAL | @ 09:00:00 | NDC 00338001704

## 2016-11-09 MED ADMIN — mupirocin 2 % topical ointment: INTRAVENOUS | @ 20:00:00 | NDC 51672131200

## 2016-11-09 SURGICAL SUPPLY — 83 items
BANDAGE MEPORE 10X3.6IN ABS IL ND AIR PERM FLXB FLD RPLNT BCK (WOUND CARE SUPPLY) ×1 IMPLANT
BANDAGE MEPORE 10X3.6IN ABS IL ND AIR PERM FLXB FLD RPLNT BCK (WOUND CARE/ENTEROSTOMAL SUPPLY) ×1
BLADE SURG CLPR W 37.2MM GP EXIST HNDL GTT IN CHRG .23MM NONST LF  DISP (SURGICAL CUTTING SUPPLIES) IMPLANT
BLADE SURG CLPR W 37.2MM GP EX_IST HNDL GTT IN CHRG .23MM (CUTTING ELEMENTS)
BOTTLE SUCT AIR-SHLD RESUSCITAIRE FILTER TUBE 800ML NEO ISLT 8K DISP (Suction) ×1 IMPLANT
CAN SUCT 2000ML 90D LOCK LID OVFLW SHTOF VALVE PR SPOUT FILTER MEDIVAC GRDN DISP LF (Suction) ×1 IMPLANT
CAN SUCT 2000ML 90D LOCK LID O_VFLW SHTOF VALVE PR SPOUT FLTR (Suction) ×1
CAN SUCT 800ML BTL DISP (Suction) ×1
CANNULA NASAL 7FT DIV ETCO2 SAMPLE O2 DEL MALE LL ADULT SLTR EYE 7FT LF (CANNULA) ×1 IMPLANT
CANNULA NASAL C02_47077725 25/CS (CANNULA) ×1
CATH SUCT ARGYLE SF-T-VC 10FR 21IN ANG WHSTLTP STR CONN PVC (Suction) ×2 IMPLANT
CONV USE 306339 - GLOVE SURG 6 LTX 3 PLY PF BEAD_CUF SMTH STRL BRN TINT (GLOVES AND ACCESSORIES) ×2 IMPLANT
CONV USE 306363 - GLOVE SURG 6.5 LTX 3 PLY PF BE_AD CUF SMTH STRL BRN TINT (GLOVES AND ACCESSORIES) ×4
CONV USE 306395 - GLOVE SURG 7.5 LTX 3 PLY PF BE_AD CUF SMTH STRL BRN TINT (GLOVES AND ACCESSORIES) ×1
CONV USE 338047 - PACK C-SECTION CUSTOM_DYNJ36214C 1EA/CS (CUSTOM TRAYS & PACK) ×1
CONV USE ITEM 108095 - SYRINGE DOVER 60ML LF  STRL IRRG PROTECT CAP BULB (NEEDLES & SYRINGE SUPPLIES) ×1 IMPLANT
CONV USE ITEM 306431 GLOVE SURG 6.5 LF INDPD THB I_NTLK BEAD CUF SFT STRL BRN (GLOVES AND ACCESSORIES) ×1
CONV USE ITEM 306558- GLOVE SURG 6.5 LF SMTH BEAD_CUF STRL BLU 12IN PROTEXIS (GLOVES AND ACCESSORIES) ×4
CONV USE ITEM 306559- GLOVE SURG 7 LF SMTH BEAD CU_F STRL BLU 12IN PROTEXIS (GLOVES AND ACCESSORIES) ×2 IMPLANT
CONV USE ITEM 308912 - GLOVE SURG 8 LF PF BEAD CUF S_MTH STRL BRN TINT 12IN (GLOVES AND ACCESSORIES) ×1
CONV USE ITEM 321825 - GLOVE SURG 8 LF PF BEAD CUF S_MTH STRL BRN TINT 12IN (GLOVES AND ACCESSORIES) ×1 IMPLANT
CONV USE ITEM 321835 - GLOVE SURG 6.5 LTX 3 PLY PF BE_AD CUF SMTH STRL BRN TINT (GLOVES AND ACCESSORIES) ×4 IMPLANT
CONV USE ITEM 321837 - GLOVE SURG 7.5 LTX 3 PLY PF BE_AD CUF SMTH STRL BRN TINT (GLOVES AND ACCESSORIES) ×1 IMPLANT
CONV USE ITEM 321839 - GLOVE SURG 6.5 LF INDPD THB I_NTLK BEAD CUF SFT STRL BRN (GLOVES AND ACCESSORIES) ×1 IMPLANT
CONV USE ITEM 321855 - GLOVE SURG 6.5 LF SMTH BEAD_CUF STRL BLU 12IN PROTEXIS (GLOVES AND ACCESSORIES) ×4 IMPLANT
DISCONTINUED USE ITEM 44909 - STAPLER SKIN 4.8X3.4MM 35 W STPL 360D ROT HEAD STRL LF  MULTIFIRE PREM DISP SS .51MM (SUTURE STAPLING DEVICES) ×1 IMPLANT
DRAIN FLAT SILI 10 1/2_SU130-1311 (WOUND CARE/ENTEROSTOMAL SUPPLY) ×1
DRAIN FLAT SILI 7MM X 10IN_SU1301310 (WOUND CARE/ENTEROSTOMAL SUPPLY) ×1
DRAIN WOUND 20CMX10MM SIL FULL PRFR WO TROCAR JP FLAT (WOUND CARE SUPPLY) ×1 IMPLANT
DRAIN WOUND 20CMX7MM SIL FULL PRFR WO TROCAR RESERVOIR JP FLAT (WOUND CARE SUPPLY) ×1 IMPLANT
ELECTRODE ESURG 360D BLADE PNC_L STD 10FT 3/32IN VLAB STRL (CAUTERY SUPPLIES) ×1
ELECTRODE ESURG 360D PNCL STD 10FT 3/32IN VLAB STRL DISP SMOKE EVAC ATTACH CORD HLSTR (CAUTERY SUPPLIES) ×1 IMPLANT
ELECTRODE PATIENT RTN 9FT VLAB C30- LB RM PHSV ACRL FOAM CORD NONIRRITATE NONSENSITIZE ADH STRP (CAUTERY SUPPLIES) ×1 IMPLANT
ELECTRODE PATIENT RTN 9FT VLAB_REM C30- LB PLHSV ACRL FOAM (CAUTERY SUPPLIES) ×1
GOWN SURG LRG AAMI L3 REINF HK_LP CLSR SET IN SLEEVE STRL LF (PROTECTIVE PRODUCTS/GARMENTS) ×2
GOWN SURG LRG L3 REINF HKLP CLSR SET IN SLEEVE STRL LF  DISP BLU SIRUS SMS 43IN (PROTECTIVE PRODUCTS/GARMENTS) ×2 IMPLANT
GOWN SURG XL AAMI L3 REINF HKL_P CLSR SET IN SLEEVE STRL LF (PROTECTIVE PRODUCTS/GARMENTS) ×1
GOWN SURG XL L3 REINF HKLP CLSR SET IN SLEEVE STRL LF  DISP BLU SIRUS SMS 47IN (PROTECTIVE PRODUCTS/GARMENTS) ×1 IMPLANT
KIT SYRG 1IN 23GA 22GA PRTX PR_VNT + FLTR PNTLK DRY LIHEP (TEST)
KIT SYRG ABG LL TIP PREATTACH NEEDLE 1IN 1.5IN PRVNT + FLTRPRO PNTLK 3ML 23GA 22GA DISP STRL LF  DRY (TEST) IMPLANT
MASK AIRLIFE PEDS OXYGEN_001260 50EA/CS (MASK) ×1
MASK O2 VNYL RSN PED MED CONCENTRATION TUBE FEATHER EDGE LF  DISP SHORT 7FT (MASK) ×1 IMPLANT
MBO USE ITEM 136448 - TRAP SP LUKI TUBE_8884724504 50EA/CS (Suction) ×1 IMPLANT
PACK C-SECTION CUSTOM_DYNJ36214C 1EA/CS (CUSTOM TRAYS & PACK) ×2 IMPLANT
PROBE SKIN 3FT NEO REPL PAD REFLC CVR THERM NONST LF  LIGHT GRN (TEMP) ×1 IMPLANT
PROBE SKIN NEO TEMP SENSOR THE_RM NONST LF LIGHT GRN (TEMP) ×1
RESERVOIR DRAIN SIL JP BULB 100CC STRL LF  DISP (WOUND CARE SUPPLY) ×1 IMPLANT
RESERVOIR DRAIN SIL JP BULB 10_0CC STRL LF DISP (WOUND CARE/ENTEROSTOMAL SUPPLY) ×1
RETRACTOR C SECTION LRG_G6313 ALEXIS O 9-14CML 5EA/BX (INSTRUMENTS) ×2
RETRACTR ALEXIS O 360D LRG RIGID RING ATRAUMA UNQ CSECT SURG 9-14CM INCS (SURGICAL INSTRUMENTS) ×1 IMPLANT
SET ADMIN 37IN 10 GTT/ML IV DUOVENT 2ND MED MALE LL ADPR HNGR DEHP STRL LF  CLRLNK (SOLUTIONS) ×1 IMPLANT
SET ADMIN 37IN 10 GTT/ML IV DU_OVENT 2ND MED MALE LL ADPR (SOLUTIONS) ×1
SOL IRRG 0.9% NACL 1000ML PLASTIC PR BTL ISTNC N-PYRG STRL LF (SOLUTIONS) ×1 IMPLANT
SOLUTION IRRG NS 2F7124 1000CC_12/CS (SOLUTIONS) ×1
SPINAL TRAY PP NEEDLE PREP SPONGE FENESTRATE DRP TW INTROD PENCAN 8.25% DEX 0.75% BPVCN BPA DEHP 3.5 (ANETHESIA SUPPLIES) ×1 IMPLANT
SPINAL TRAY PP NEEDLE PREP SPO_NGE FENESTRATE DRP TW INTROD (ANETHESIA SUPPLIES) ×1
SPONGE GAUZE 3X3 STERILE 2S 1903 25EA/48BX/CS (WOUND CARE SUPPLY) ×1 IMPLANT
SPONGE GAUZE 3X3 STERILE 2S 1903 25EA/48BX/CS (WOUND CARE/ENTEROSTOMAL SUPPLY) ×1
SPONGE LAP 18IN X 18IN_400 40/CS (WOUND CARE/ENTEROSTOMAL SUPPLY) ×1
SPONGE LAP 18X18IN PREWASH WHT (WOUND CARE SUPPLY) ×1 IMPLANT
STAPLER SKIN 4.8X3.4MM 35 W STPL 360D ROT HEAD STRL LF  MULTIFIRE PREM DISP SS .51MM (SUTURE STAPLING DEVICES) ×1 IMPLANT
STAPLER SKIN ROTATING 35 WIDE_059037 6EA/BX STRL (SUTURE STAPLING DEVICES) ×1
SUTURE 0 CT1 VICRYL 36IN UNDYED BRD COAT ABS (SUTURE/WOUND CLOSURE) ×2 IMPLANT
SUTURE 0 CT1 VICRYL 36IN UNDYE_D BRD COAT ABS (SUTURE/WOUND CLOSURE) ×2
SUTURE 2-0 CT1 VICRYL 36IN UNDYED BRD COAT ABS (SUTURE/WOUND CLOSURE) ×1 IMPLANT
SUTURE 2-0 CT1 VICRYL 36IN UND_YED BRD TIE 12 STRN COAT ABS (SUTURE/WOUND CLOSURE) ×1
SUTURE 2-0 XLH VICRYL 27IN VIOL BRD COAT ABS (SUTURE/WOUND CLOSURE) IMPLANT
SUTURE 2-0 XLH VICRYL 27IN VIO_L BRD COAT ABS (SUTURE/WOUND CLOSURE)
SUTURE 3-0 CT1 VICRYL 36IN UNDYED BRD COAT ABS (SUTURE/WOUND CLOSURE) ×1 IMPLANT
SUTURE 3-0 CT1 VICRYL 36IN UND_YED BRD COAT ABS (SUTURE/WOUND CLOSURE) ×1
SUTURE CHR 0 LIGAPK 54IN BRN MONOF REEL ABS (SUTURE/WOUND CLOSURE) ×1 IMPLANT
SUTURE CHR 1 CT 36IN BRN MONOF ABS (SUTURE/WOUND CLOSURE) ×3 IMPLANT
SUTURE CHR GUT 0 LIGAPK 54IN B_RN MONOF REEL ABS (SUTURE/WOUND CLOSURE) ×1
SUTURE CHR GUT 1 CT 36IN MONOF_ABS (SUTURE/WOUND CLOSURE) ×3
SUTURE SILK 2-0 FS PERMAHAND 18IN BLK BRD NONAB (SUTURE/WOUND CLOSURE) ×1 IMPLANT
SUTURE SILK 2-0 FS PERMAHAND 1_8IN BLK BRD NONAB (SUTURE/WOUND CLOSURE) ×1
SYRINGE 10ML LF STRL ST MED D_ISP (NEEDLES & SYRINGE SUPPLIES) ×1
SYRINGE 50CC LF STRL IRRG FIN_GER FLNG BULB DISP (NEEDLES & SYRINGE SUPPLIES) ×1
SYRINGE BD 10ML LF  STRL ST MED DISP (NEEDLES & SYRINGE SUPPLIES) ×1 IMPLANT
SYRINGE DOVER 60ML LF  STRL IRRG PROTECT CAP BULB (NEEDLES & SYRINGE SUPPLIES) ×1
TRAP SP LUKI TUBE_8884724504 50EA/CS (Suction) ×1
TRAY CATH 16FR SURESTEP BARDEX LUBRICATH FOLEY DRAIN BAG STRL LTX DISP (Drains/Resovoirs) ×1 IMPLANT
TRAY CATH 16FR SURESTEP BARDEX_LUBRICATH FOLEY DRAIN BAG (Drains/Resovoirs) ×1

## 2016-11-09 NOTE — Care Plan (Signed)
Problem: Postpartum (Cesarean Delivery) (Adult,Obstetrics,Pediatric)  Prevent and manage potential problems including:  1. bowel motility decreased  2. hemorrhage  3. infection  4. pain  5. postoperative nausea and vomiting  6. postoperative urinary retention  7. respiratory compromise  8. situational response  9. VTE (venous thromboembolism)  10. wound healing impaired   Goal: Anesthesia/Sedation Recovery  Outcome: Ongoing (see interventions/notes)

## 2016-11-09 NOTE — Nurses Notes (Signed)
Dr Cannon Kettledurnell to floor with dr Su Hiltroberts. To pt room dr Su Hiltroberts assessed incision. And talked to pt about removing jp drain. jp drain removed per dr Su Hiltroberts and pressure applied. Dr Su Hiltroberts stated to continue to use ice and apply a weight. Continued to hold pressure on jp insertion site and applied ice and weighted sand bag of 10 lbs. inst pt to call out if she feels any leaking or oozying, verbalized understanding.

## 2016-11-09 NOTE — Nurses Notes (Signed)
Called OR to get ahold of Dr. Cannon Kettleurnell.  They transferred me into the room where he was.  I told the person on the phone to let Dr. Cannon Kettleurnell that the patient's site now has hematoma throughout the incision site and it is oozing around the JP drain site and not going into the drain.  She told Dr. Cannon Kettleurnell and he said he would be up.

## 2016-11-09 NOTE — OR PostOp (Signed)
Awake and alert  Skin w/d, lips and nailbeds pink  resp easy and unlabored  abd soft  Fundus massaged, midline, firm @ U/1  Low transverse abd dressing dry and intact  J-P drain to RLQ intact draining small amount thin bloody drainage, dressing dry and intact  Foley cath patent and draining clear yellow urine in tubing  Dermatomes L2-L3 bilat  Gross motor movement of lower extremities  IV to RFA with 100cc LR with 30 units pitocin  1215 resting quietly  resp easy and unlabored  Denies pain  1230 resting quietly  resp easy and unlabored  C/o cramping pain to low abd  Rates pain 5/10  1235 medicated for pain  1245 resting quietly  resp easy and unlabored  Rates pain 3/10, tol  1300 resting quietly  resp easy and unlabored  Rates pain 3/10, tol  1315 awake and alert  Skin w/d, lips and nailbeds pink  resp easy and unlabored  abd soft  Dressing to low abd dry and intact with areas of shadow drainage marked  Fundus massaged, firm,midline @ U/1  J-P drain to RLQ intact draining small amount bloody drainage  Peri pad changed for moderate amount rubra lochia, no clots  Foley cath patent and draining clear yellow urine in tubing  Dermatomes to toe tips bilat  Moving all extremities  Rates pain 3/10, tol  100cc LR with pitocin infused  Awaiting transport

## 2016-11-09 NOTE — Anesthesia Preprocedure Evaluation (Signed)
ANESTHESIA PRE-OP EVALUATION  Planned Procedure: CESAREAN SECTION, BILATERAL TUBAL LIGATION (N/A Abdomen)  Review of Systems     anesthesia history negative     patient summary reviewed  nursing notes reviewed        Pulmonary  negative pulmonary ROS,    Cardiovascular  negative cardio ROS,   ECG reviewed No peripheral edema,        GI/Hepatic/Renal    GERD and well controlled     Endo/Other   neg endo/other ROS,        Neuro/Psych/MS  negative neuro/psych ROS,      Cancer  negative hematology/oncology ROS,                Physical Assessment      Patient summary reviewed and Nursing notes reviewed   Airway       Mallampati: I    TM distance: >3 FB    Neck ROM: full  Mouth Opening: good.  No Facial hair  No Beard  No endotracheal tube present  No Tracheostomy present    Dental                      Pulmonary    Breath sounds clear to auscultation  (-) no rhonchi, no decreased breath sounds, no wheezes, no rales and no stridor     Cardiovascular    Rhythm: regular  Rate: Normal  (-) no friction rub, carotid bruit is not present, no peripheral edema and no murmur     Other findings            Plan  Planned anesthesia type: spinal and epidural    ASA 2         Anesthetic plan and risks discussed with patient.     Anesthesia issues/risks discussed are: Dental Injuries, PONV, Sore Throat, Aspiration, Cardiac Events/MI, Post-op Pain Management, High Neuraxial Block, Failure of Block, Nerve Injuries and Local Anesthetic Systemic Toxicity.    Use of blood products discussed with patient whom consented to blood products.     Patient's NPO status is appropriate for Anesthesia.  NPO Status: Full stomach precautions.         Plan discussed with CRNA.

## 2016-11-09 NOTE — Care Plan (Signed)
Problem: Postpartum (Cesarean Delivery) (Adult,Obstetrics,Pediatric)  Prevent and manage potential problems including: 1. bowel motility decreased 2. hemorrhage 3. infection 4. pain 5. postoperative nausea and vomiting 6. postoperative urinary retention 7. respiratory compromise 8. situational response 9. VTE (venous thromboembolism) 10. wound healing impaired   Goal: Signs and Symptoms of Listed Potential Problems Will be Absent, Minimized or Managed (Postpartum)  Signs and symptoms of listed potential problems will be absent, minimized or managed by discharge/transition of care (reference Postpartum (Cesarean Delivery) (Adult,Obstetrics,Pediatric) CPG).   Outcome: Ongoing (see interventions/notes)

## 2016-11-09 NOTE — OR Nursing (Signed)
Viable baby girl  Birth time: 1114  APGAR: 9/9  Weight: 7lb 1.9oz

## 2016-11-09 NOTE — Nurses Notes (Signed)
Dr. Cannon Kettleurnell just called and wanted to know what was going on.  I told him that the patient's dressing was now saturated and we took the dressing off and noticed that it was oozing around the JP drain site and now she has hematoma along her incision site that was very hard and painful.  He stated that the OR team said that it looked fine.  I told him that when she arrived it was not completely saturated and it looked like the drain was working as there was fluid in it, but now it is not working and she had saturated her dressing and is oozing around the site and while the nurses were in there, the hematomas had gotten bigger.  He said he would be up to see the patient.

## 2016-11-09 NOTE — Nurses Notes (Signed)
Called Dr. Delma Officerurnell's cell and left voicemail about patient's incision site how it was saturated and jp site was draining like a river down.

## 2016-11-09 NOTE — Nurses Notes (Signed)
Took breastpump to pt room to set up for breast pumping pt has a lot of family at bedside and wants to wait till later. inst to call out with any questions and assist with setting up with pump verbalized understanding.

## 2016-11-09 NOTE — Nurses Notes (Signed)
Dr Cannon Kettledurnell to floor to see pt. Assessed incision and jp wants ice applied and he will have someone come up and look at the incision.

## 2016-11-09 NOTE — Anesthesia Procedure Notes (Signed)
Neuraxial Block    Sedation        Blocks   Block: spinal   Indication:at surgeon's request and primary anesthetic   Diagnosis: abdominal pain    Requesting surgeon: Lennart PallURNELL, THOMAS  Technique:(See MAR for doses)  Approach: midline        Needle level: L3-4  Skin Prep: povidone-iodine 7.5% surgical scrub and Antiseptic wash, Cap, Draped, Hand hygiene performed, Large sterile sheet, Mask, Sterile drape, Sterile field established, Sterile gloves, Sterile technique and Sterilely prepped and draped   Preanesthesia Checklist:  Pre anesthesia checklist: site marked, surgical consent, monitors and equipment checked, timeout performed, anesthesia consent and positioning concerns  Position:  Positioning: sitting   Skin Local:  Skin local: Lidocaine 1% Skin local comment: 3cc  Spinal Needle:  Spinal needle:Pencan    Spinal Needle gauge: 25 G    Spinal needle length: 4 inch Spinal needle attempts: 1.  Epidural Needle:                Epidural Catheter:            Block Events:  Procedure Events:CSF   Test dose:             Dosing      Patient response comfortable and adequate sensory block.    NOTES  Block free text:Injected fentanyl and spinal bupivicaine.  Good CSF pre and post injection.  Performed by:      Anesthesiologist: Abrian Hanover, Armanda Heritage'ANN

## 2016-11-09 NOTE — H&P (Signed)
PATIENT NAME: Shelly Molina, Shelly Molina   HOSPITAL NUMBER:  [id number]  DATE OF SERVICE: 11/09/2016  DATE OF BIRTH:  1996-03-22    HISTORY AND PHYSICAL    HISTORY OF PRESENT ILLNESS:  This patient is a 21 year old gravida 4, para 2, AB1 white female status post 2 previous cesarean sections.  The patient's [facility] January [facility], 2016 [facility]  [facility].  Blood type 0+, serology nonreactive, group B strep unavailable.  The patient is immune to rubella.  Hepatitis B surface antigen was negative.  One hour glucose test 85.  Urine drug screen was positive for marijuana.  Genital swab was positive for bacterial vaginosis and Trichomonas.  Pap smear showed atypical cells of  uncertain significance with positive high-risk HPV.  The patient is requesting sterilization.  She understands the procedure is permanent and that there is a 1/500 chance for failure.    PAST MEDICAL HISTORY:  Significant for tonsillectomy and root canal.    MEDICATIONS:  Prenatal vitamins.    ALLERGIES:  None.    SOCIAL HISTORY:  The patient smokes 2 cigarettes a day.  She does not drink alcohol.  She is single and she works for [employer].    FAMILY HISTORY:  Positive for spina bifida and cystic fibrosis.    REVIEW OF SYSTEMS:  Negative.    PHYSICAL EXAMINATION:  VITAL SIGNS:  Blood pressure 98/62, weight 136, height 5 feet 1 inch.    SKIN:  Warm and clear.   HEENT:  Head, ears, eyes, nose and throat normal.   NECK:  Supple without lymphadenopathy or thyroid enlargement.    CHEST:  Clear.   BACK:  Nontender.   BREASTS:  Soft without palpable mass.   HEART:  Reveals regular rate and rhythm without murmur or gallop.  Normal S1, S2.    ABDOMEN:  Soft, gravid, fundal height 30 cm, vertex presentation.  Fetal heart tones 168.  Pelvic examination was not performed.    EXTREMITIES:  Without edema or deformity.   NEUROLOGIC EXAM:  Grossly intact.    ASSESSMENT:  A 21 year old white female with  term pregnancy, scheduled for a repeat C-section and bilateral tubal ligation.        Lennart Pallhomas Candus Braud, MD                DD:  11/09/2016 07:33:29  DT:  11/09/2016 07:47:07 WRG  D#:  [id number]

## 2016-11-09 NOTE — Nurses Notes (Signed)
Nursing assessment completed.  Pt's Telfa dressing saturated with blood, changed, new ice pack applied, sand bag in place.  Voices no needs or concerns at this time.  Call light in reach, safety measures maintained.

## 2016-11-09 NOTE — Anesthesia Postprocedure Evaluation (Signed)
Anesthesia Post Op Evaluation    Patient: Shelly Molina  Procedure(s):  CESAREAN SECTION, BILATERAL TUBAL LIGATION    Last Vitals:Temperature: 36.6 C (97.9 F) (11/09/16 1810)  Heart Rate: 71 (11/09/16 1810)  BP (Non-Invasive): 106/65 (11/09/16 1810)  Respiratory Rate: 14 (11/09/16 1810)  SpO2-1: 99 % (11/09/16 1810)  Pain Score (Numeric, Faces): 6 (11/09/16 1345)  Patient is sufficiently recovered from the effects of anesthesia to participate in the evaluation and has returned to their pre-procedure level.  Patient location during evaluation: PACU   Post-procedure handoff checklist completed    Patient participation: complete - patient participated  Level of consciousness: awake and alert and responsive to verbal stimuli  Pain management: adequate  Airway patency: patent  Anesthetic complications: no  Cardiovascular status: acceptable  Respiratory status: acceptable  Hydration status: acceptable  Patient post-procedure temperature: Pt Normothermic   PONV Status: Absent

## 2016-11-09 NOTE — Anesthesia Transfer of Care (Signed)
ANESTHESIA TRANSFER OF CARE   Sherlon HandingHannah Molina is a 21 y.o. ,female, Weight: 61.2 kg (135 lb)   had Procedure(s):  CESAREAN SECTION, BILATERAL TUBAL LIGATION  performed  11/09/16   Primary Service: Lennart Pallhomas Durnell, MD    History reviewed. No pertinent past medical history.   Allergy History as of 11/09/16      No Known Allergies              I completed my transfer of care / handoff to the receiving personnel during which we discussed:  All key/critical aspects of case discussed                                                                    Last OR Temp: Temperature: 36.3 C (97.4 F)  ABG:  KETONES   Date Value Ref Range Status   10/05/2016 Not Detected Not Detected mg/dL Final     Airway:   Blood pressure 113/77, pulse 78, temperature 36.3 C (97.4 F), resp. rate 20, height 1.549 m (5\' 1" ), weight 61.2 kg (135 lb), SpO2 96 %, unknown if currently breastfeeding.

## 2016-11-10 LAB — CBC
HCT: 21.3 % — ABNORMAL LOW (ref 37.0–47.0)
HGB: 6.9 g/dL — CL (ref 12.0–16.0)
MCH: 25.1 pg — ABNORMAL LOW (ref 27.0–31.0)
MCHC: 32.4 g/dL — ABNORMAL LOW (ref 33.0–37.0)
MCV: 77.5 fL — ABNORMAL LOW (ref 80.0–99.0)
PLATELETS: 268 x10?3/uL (ref 130–400)
PLATELETS: 268 x10ˆ3/uL (ref 130–400)
RBC: 2.75 x10ˆ6/uL — ABNORMAL LOW (ref 4.20–5.40)
RDW: 16.6 % — ABNORMAL HIGH (ref 11.5–14.5)
WBC: 11.3 10*3/uL — ABNORMAL HIGH (ref 4.8–10.8)
WBC: 11.3 x10ˆ3/uL — ABNORMAL HIGH (ref 4.8–10.8)

## 2016-11-10 LAB — HISTORICAL SURGICAL PATHOLOGY SPECIMEN

## 2016-11-10 MED ORDER — SIMETHICONE 80 MG CHEWABLE TABLET
80.00 mg | CHEWABLE_TABLET | Freq: Four times a day (QID) | ORAL | Status: DC | PRN
Start: 2016-11-10 — End: 2016-11-10

## 2016-11-10 MED ORDER — SIMETHICONE 80 MG CHEWABLE TABLET
80.00 mg | CHEWABLE_TABLET | Freq: Four times a day (QID) | ORAL | Status: DC | PRN
Start: 2016-11-10 — End: 2016-11-11
  Administered 2016-11-10: 80 mg via ORAL
  Filled 2016-11-10: qty 1

## 2016-11-10 NOTE — Care Plan (Signed)
Problem: Postpartum (Cesarean Delivery) (Adult,Obstetrics,Pediatric)  Prevent and manage potential problems including:  1. bowel motility decreased  2. hemorrhage  3. infection  4. pain  5. postoperative nausea and vomiting  6. postoperative urinary retention  7. respiratory compromise  8. situational response  9. VTE (venous thromboembolism)  10. wound healing impaired   Goal: Signs and Symptoms of Listed Potential Problems Will be Absent, Minimized or Managed (Postpartum)  Signs and symptoms of listed potential problems will be absent, minimized or managed by discharge/transition of care (reference Postpartum (Cesarean Delivery) (Adult,Obstetrics,Pediatric) CPG).   Outcome: Ongoing (see interventions/notes)    Goal: Anesthesia/Sedation Recovery  Outcome: Ongoing (see interventions/notes)      Problem: Pain, Acute (Adult)  Goal: Identify Related Risk Factors and Signs and Symptoms  Related risk factors and signs and symptoms are identified upon initiation of Human Response Clinical Practice Guideline (CPG).   Outcome: Ongoing (see interventions/notes)    Goal: Acceptable Pain Control/Comfort Level  Patient will demonstrate the desired outcomes by discharge/transition of care.   Outcome: Ongoing (see interventions/notes)      Problem: Cesarean Delivery (Adult,Obstetrics,Pediatric)  Prevent and manage potential problems including:  1. anaphylactoid syndrome  2. fetal wellbeing change  3. hypothermia  4. intrapartum bleeding  5. perioperative injury  6. respiratory compromise   Goal: Signs and Symptoms of Listed Potential Problems Will be Absent, Minimized or Managed (Cesarean Delivery)  Signs and symptoms of listed potential problems will be absent, minimized or managed by discharge/transition of care (reference Cesarean Delivery (Adult,Obstetrics,Pediatric) CPG).   Outcome: Ongoing (see interventions/notes)      Comments: Care Plan Note    Situation: Post CS day    Intervention: pain meds, monitor incision drainage     Response: Incision CDI, pain controlled with pain meds      Shawna Clamparrie Kohana Amble, RN

## 2016-11-10 NOTE — OR Surgeon (Signed)
PATIENT NAME: Shelly Molina Molina, Shelly Molina   HOSPITAL NUMBER:  [id number]  DATE OF SERVICE: 11/09/2016  DATE OF BIRTH:  Apr 01, 1996    OPERATIVE REPORT    PREOP DIAGNOSES:  1. Term pregnancy.  2. Prior cesarean sections x2.  3. Elective sterilization.    POSTOP DIAGNOSIS:  1. Term pregnancy.  2. Prior cesarean sections x2.  3. Elective sterilization.    PROCEDURE:  1. Repeat low transverse cesarean section.  2. Excision of old skin scar.  3. Modified bilateral Pomeroy tubal ligation.    ANESTHESIA:  Spinal.    BLOOD LOSS:  400.    COMPLICATIONS:  None.    TUBES:  Foley catheter.    DRAINS:  10 mm flat JP drain in the subfascial space.    PACKS:  None.    FINDINGS:  The infant was a 7 pound 2 ounce female in the vertex presentation with Apgar scores of 9 and 9.  The amniotic fluid was clear.  Fallopian tubes and ovaries were normal.  The lower uterine segment was extremely thin.    OPERATIVE SUMMARY:  The patient was taken to the operating room and given a spinal block by Dr. Georgette Doveruesterhoeft.  In the frogleg position the Foley catheter was placed.  The abdomen was prepped and draped in the usual manner.  The patient's old Pfannenstiel scar was excised with a scalpel.  The subcutaneous tissue and the anterior rectus sheath were incised transversely.  Mayo scissors were used to extend the incision laterally.  The rectus muscles were separated at the midline.  Posterior sheath and peritoneum were entered sharply.  The peritoneum was opened down to the bladder reflection with Metzenbaum scissors.  The large Alexis retractor was placed.  The uterovesical peritoneum was opened transversely with Metzenbaum scissors.  Low transverse incision was made with a scalpel.  Bandage scissors were used to extend the incision laterally.  The infant was delivered with a vertex presentation.  The mouth and nose were suctioned with the wall suction.  The cord was clamped and divided.  Infant was shown to the mom and then placed in the infant warmer.   Cord blood samples were obtained.  The placenta was manually removed.  Uterus was brought out through the incision.  The interior was cleaned with a laparotomy pad.  The incision was closed with a running interlocking #1 chromic suture.  The bladder reflection was closed with a running 2-0 Vicryl suture.  A Babcock clamp was used to grasp the mid portion of each tube.  The mesosalpinx was opened with a hemostat.  The middle one-third of each tube was isolated with two ligatures of 0 chromic suture.  This segment was excised with Metzenbaum scissors and the cut ends were cauterized with the Bovie.  The uterus was placed back in the abdominal cavity.  Gutters were irrigated with normal saline.  Sponge and needle counts were correct.  Peritoneum was closed with a running 0 Vicryl suture.  Muscle was approximated with 0 Vicryl suture.  The fascia was closed with running 0 Vicryl suture x2.  There was some bleeding from the muscle and a 10 mm flat JP drain was placed beneath the fascia exiting the center of the incision prophylactically.  The subcutaneous layer was irrigated with normal saline and was closed with a running 3-0 Vicryl suture.  The skin was closed with a stapler.  The incision was cleansed and a sterile dressing was applied.  The patient was given intravenous antibiotic prophylaxis.  Then,  the patient was then transferred to recovery awake and in good condition.        Lennart Pall, MD                DD:  11/09/2016 13:06:43  DT:  11/10/2016 06:23:10 LRS  D#:  [id number]

## 2016-11-10 NOTE — Progress Notes (Signed)
Lennart Pallhomas Maribell Demeo, MD  [facility]  [facility]   POST-PARTUM PROGRESS NOTE        PATIENT: Shelly HandingHannah Molina  CHART NUMBER: [id number]  DATE OF SERVICE: 11/10/2016    SUBJECTIVE       Shelly Molina is a 21 y.o. [id number] POD#1       Complaints  None   Ambulation Yes   Urination Yes   Diet common adult   BM or passing Flatus Yes   Lochia or cramping Normal         OBJECTIVE     Filed Vitals:    11/09/16 2000 11/10/16 0100 11/10/16 0615 11/10/16 0720   BP: 113/63 110/68 112/78 114/71   Pulse: 70 77 88 (!) 102   Resp: 18 14 16 20    Temp: 37.1 C (98.7 F) 36.2 C (97.2 F) 36.3 C (97.4 F) 37.7 C (99.8 F)   SpO2: 98% 99%  98%     CBC Results Differential Results   Recent Labs      11/10/16   0257   WBC  11.3*   HGB  6.9*   HCT  21.3*   PLTCNT  268    No results found for this or any previous visit (from the past 30 hour(s)).     GENERAL: NAD  ABD: Soft, NTTP, BSx4   EXT: No calf tenderness  Incision appears normal    ASSESSMENT and PLAN     This is a 21 y.o. [id number] POD#1 status-post c-section doing well.    1. Post-partum Care:   - Ambulating, urinating, tolerating POs.   - Pain well-controlled              - Home tomorrow.    Lennart Pallhomas Meigan Pates, MD

## 2016-11-10 NOTE — Nurses Notes (Signed)
Small amount of serous sanguineous drainage noted to telfa, ice pack changed, sand bag reapplied. Patient remains on bedside.  IV and PCA infusing.  Family at bedside.

## 2016-11-10 NOTE — Nurses Notes (Signed)
Called dr Cannon Kettledurnell about pt's h&h said he was aware heather had called him earlier. No new orders.

## 2016-11-10 NOTE — Nurses Notes (Signed)
Foley removed, SCDs removed, IV converted to IVL.  Sandbag removed from abdominal incision.  Telfa and ice pack changed to low, transverse abdominal incision, no active drainage noted.  Peri care completed, small amount of rubra lochia noted.  Bruising and hematoma noted to incision.  Patient stated, "I feel better today than I did yesterday."

## 2016-11-10 NOTE — Nurses Notes (Signed)
Ice pack changed to abdominal incision.  No new drainage present on telfa.  Sand bag reapplied.

## 2016-11-11 MED ORDER — IBUPROFEN 800 MG TABLET: 800 mg | Tab | Freq: Three times a day (TID) | ORAL | 0 refills | 0 days | Status: DC | PRN

## 2016-11-11 MED ORDER — OXYCODONE-ACETAMINOPHEN 5 MG-325 MG TABLET
1.00 | ORAL_TABLET | Freq: Three times a day (TID) | ORAL | 0 refills | Status: AC | PRN
Start: 2016-11-11 — End: 2016-12-10

## 2016-11-11 MED ORDER — PRENATAL VIT-IRON CARB-FOLIC ACID-DOCUSATE 90 MG-1 MG-50 MG TABLET
1.0000 | ORAL_TABLET | Freq: Every day | ORAL | 0 refills | Status: DC
Start: 2016-11-11 — End: 2018-03-02

## 2016-11-11 MED ADMIN — ibuprofen 800 mg tablet: ORAL | @ 13:00:00

## 2016-11-11 MED ADMIN — mupirocin 2 % topical ointment: ORAL | @ 05:00:00 | NDC 45802011222

## 2016-11-11 NOTE — Care Plan (Signed)
Problem: Pain, Acute (Adult)  Goal: Identify Related Risk Factors and Signs and Symptoms  Related risk factors and signs and symptoms are identified upon initiation of Human Response Clinical Practice Guideline (CPG).   Outcome: Adequate for Discharge Date Met: 11/11/16  Pain managed well with ordered medication. Incision is tender and very bruised. Warm heat may help blood reabsorb instructions given. Discussed ways to keep staples from getting stuck on fabric.

## 2016-11-11 NOTE — Care Plan (Signed)
Problem: Postpartum (Cesarean Delivery) (Adult,Obstetrics,Pediatric)  Prevent and manage potential problems including:  1. bowel motility decreased  2. hemorrhage  3. infection  4. pain  5. postoperative nausea and vomiting  6. postoperative urinary retention  7. respiratory compromise  8. situational response  9. VTE (venous thromboembolism)  10. wound healing impaired   Goal: Signs and Symptoms of Listed Potential Problems Will be Absent, Minimized or Managed (Postpartum)  Signs and symptoms of listed potential problems will be absent, minimized or managed by discharge/transition of care (reference Postpartum (Cesarean Delivery) (Adult,Obstetrics,Pediatric) CPG).   Outcome: Adequate for Discharge Date Met: 11/11/16  Pain managed well with oral pain meds and prescritions given. Instructed on using stool softeners twice daily or laxative prn. Ambulation instructions, fiber, fluids.
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
## 2016-11-11 NOTE — Care Plan (Signed)
Problem: Postpartum (Cesarean Delivery) (Adult,Obstetrics,Pediatric)  Prevent and manage potential problems including:  1. bowel motility decreased  2. hemorrhage  3. infection  4. pain  5. postoperative nausea and vomiting  6. postoperative urinary retention  7. respiratory compromise  8. situational response  9. VTE (venous thromboembolism)  10. wound healing impaired   Goal: Anesthesia/Sedation Recovery  Outcome: Outcome Achieved Date Met: 11/11/16

## 2016-11-11 NOTE — Care Plan (Signed)
Problem: Pain, Acute (Adult)  Goal: Acceptable Pain Control/Comfort Level  Patient will demonstrate the desired outcomes by discharge/transition of care.   Outcome: Adequate for Discharge Date Met: 11/11/16  Pain managed well with medications ordered and for home prescriptions

## 2016-11-11 NOTE — Progress Notes (Signed)
Lennart Pallhomas Betsi Crespi, MD  [facility]  [facility]   POST-PARTUM PROGRESS NOTE        PATIENT: Shelly Molina Petterson  CHART NUMBER: [id number]  DATE OF SERVICE: 11/11/2016    SUBJECTIVE       Shelly Molina Wilsey is a 21 y.o. [id number] POD#2       Complaints  None   Ambulation Yes   Urination Yes   Diet common adult   BM or passing Flatus Yes   Lochia or cramping Normal         OBJECTIVE     Filed Vitals:    11/10/16 0100 11/10/16 0615 11/10/16 0720 11/10/16 2049   BP: 110/68 112/78 114/71 113/67   Pulse: 77 88 (!) 102 93   Resp: 14 16 20 18    Temp: 36.2 C (97.2 F) 36.3 C (97.4 F) 37.7 C (99.8 F) 37.9 C (100.3 F)   SpO2: 99%  98% 100%     CBC Results Differential Results   No results found for this encounter No results found for this or any previous visit (from the past 30 hour(s)).     GENERAL: NAD  ABD: Soft, NTTP, BSx4   EXT: No calf tenderness  Incision appears normal    ASSESSMENT and PLAN     This is a 21 y.o. [id number] POD#2 status-post c-section doing well.    1. Post-partum Care:   - Ambulating, urinating, tolerating POs.   - Pain well-controlled              - Home with instructions.    Lennart Pallhomas Luke Falero, MD

## 2016-11-11 NOTE — Care Plan (Signed)
Problem: Cesarean Delivery (Adult,Obstetrics,Pediatric)  Prevent and manage potential problems including:  1. anaphylactoid syndrome  2. fetal wellbeing change  3. hypothermia  4. intrapartum bleeding  5. perioperative injury  6. respiratory compromise   Goal: Signs and Symptoms of Listed Potential Problems Will be Absent, Minimized or Managed (Cesarean Delivery)  Signs and symptoms of listed potential problems will be absent, minimized or managed by discharge/transition of care (reference Cesarean Delivery (Adult,Obstetrics,Pediatric) CPG).   Outcome: Adequate for Discharge Date Met: 11/11/16

## 2018-03-02 ENCOUNTER — Emergency Department (HOSPITAL_COMMUNITY): Payer: MEDICAID | Admitting: Radiology

## 2018-03-02 ENCOUNTER — Emergency Department (HOSPITAL_COMMUNITY): Payer: MEDICAID

## 2018-03-02 ENCOUNTER — Emergency Department
Admission: EM | Admit: 2018-03-02 | Discharge: 2018-03-02 | Disposition: A | Discharge status: Home / Self Care | Payer: MEDICAID | Attending: Emergency Medicine | Admitting: Emergency Medicine

## 2018-03-02 ENCOUNTER — Encounter (HOSPITAL_COMMUNITY): Payer: Self-pay

## 2018-03-02 ENCOUNTER — Other Ambulatory Visit: Payer: Self-pay

## 2018-03-02 ENCOUNTER — Emergency Department (HOSPITAL_COMMUNITY): Payer: Self-pay

## 2018-03-02 DIAGNOSIS — F17200 Nicotine dependence, unspecified, uncomplicated: Secondary | ICD-10-CM | POA: Insufficient documentation

## 2018-03-02 DIAGNOSIS — R9431 Abnormal electrocardiogram [ECG] [EKG]: Secondary | ICD-10-CM

## 2018-03-02 DIAGNOSIS — S301XXA Contusion of abdominal wall, initial encounter: Secondary | ICD-10-CM | POA: Insufficient documentation

## 2018-03-02 DIAGNOSIS — S0990XA Unspecified injury of head, initial encounter: Secondary | ICD-10-CM | POA: Insufficient documentation

## 2018-03-02 DIAGNOSIS — R079 Chest pain, unspecified: Secondary | ICD-10-CM

## 2018-03-02 DIAGNOSIS — S20219A Contusion of unspecified front wall of thorax, initial encounter: Secondary | ICD-10-CM

## 2018-03-02 DIAGNOSIS — S20212A Contusion of left front wall of thorax, initial encounter: Secondary | ICD-10-CM | POA: Insufficient documentation

## 2018-03-02 DIAGNOSIS — N390 Urinary tract infection, site not specified: Secondary | ICD-10-CM | POA: Insufficient documentation

## 2018-03-02 DIAGNOSIS — Z041 Encounter for examination and observation following transport accident: Secondary | ICD-10-CM | POA: Insufficient documentation

## 2018-03-02 LAB — CBC WITH DIFF
BASOPHIL #: 0.1 x10ˆ3/uL (ref 0.00–1.00)
BASOPHIL %: 1 % (ref 0–1)
EOSINOPHIL #: 0.1 x10ˆ3/uL (ref 0.00–0.40)
EOSINOPHIL %: 1 % (ref 1–5)
HCT: 40.3 % (ref 37.0–47.0)
HGB: 13.1 g/dL (ref 12.0–16.0)
LYMPHOCYTE #: 2.8 x10?3/uL (ref 1.00–4.00)
LYMPHOCYTE #: 2.8 x10ˆ3/uL (ref 1.00–4.00)
LYMPHOCYTE %: 33 % (ref 25–40)
MCH: 28.3 pg (ref 27.0–31.0)
MCHC: 32.6 g/dL — ABNORMAL LOW (ref 33.0–37.0)
MCV: 86.6 fL (ref 80.0–99.0)
MONOCYTE #: 1.2 x10ˆ3/uL — ABNORMAL HIGH (ref 0.10–0.80)
MONOCYTE %: 15 % — ABNORMAL HIGH (ref 4–10)
NEUTROPHIL #: 4.3 x10ˆ3/uL (ref 1.80–7.00)
NEUTROPHIL %: 51 % (ref 50–73)
PLATELETS: 407 x10ˆ3/uL — ABNORMAL HIGH (ref 130–400)
RBC: 4.65 x10ˆ6/uL (ref 4.20–5.40)
RDW: 20.5 % — ABNORMAL HIGH (ref 11.5–14.5)
WBC: 8.5 x10ˆ3/uL (ref 4.8–10.8)

## 2018-03-02 LAB — URINE DRUG SCREEN
AMPHETAMINES URINE: DETECTED — AB
BARBITURATES URINE: NOT DETECTED
BENZODIAZEPINES URINE: NOT DETECTED
BUPRENORPHINE URINE: NOT DETECTED
CANNABINOIDS URINE: DETECTED — AB
COCAINE METABOLITES URINE: NOT DETECTED
ECSTASY/MDMA URINE: NOT DETECTED
METHADONE URINE: NOT DETECTED
METHADONE URINE: NOT DETECTED
METHAMPHETAMINES URINE: DETECTED — AB
OPIATES URINE: NOT DETECTED
OXYCODONE URINE: NOT DETECTED
PCP URINE: NOT DETECTED
PROPOXYPHENE URINE: NOT DETECTED
PROPOXYPHENE URINE: NOT DETECTED

## 2018-03-02 LAB — SCAN DIFFERENTIAL: WBC MORPHOLOGY COMMENT: NORMAL

## 2018-03-02 LAB — URINALYSIS, MACROSCOPIC
BLOOD: NOT DETECTED mg/dL
GLUCOSE: NOT DETECTED mg/dL
ICTOTEST: NOT DETECTED
LEUKOCYTES: NOT DETECTED WBCs/uL
NITRITE: NOT DETECTED
PH: 5 — ABNORMAL LOW (ref 5.0–8.0)
SPECIFIC GRAVITY: 1.033 — ABNORMAL HIGH (ref 1.005–1.030)
UROBILINOGEN: 4 mg/dL — AB

## 2018-03-02 LAB — COMPREHENSIVE METABOLIC PANEL, NON-FASTING
ALBUMIN: 5 g/dL — ABNORMAL HIGH (ref 3.6–4.8)
ALKALINE PHOSPHATASE: 69 U/L (ref 38–126)
ALKALINE PHOSPHATASE: 69 U/L (ref 38–126)
ALT (SGPT): 24 U/L (ref 3–45)
ANION GAP: 17 mmol/L
AST (SGOT): 20 U/L (ref 7–56)
BILIRUBIN TOTAL: 1 mg/dL (ref 0.2–1.3)
BUN/CREA RATIO: 18
BUN: 16 mg/dL (ref 7–18)
CALCIUM: 10.4 mg/dL — ABNORMAL HIGH (ref 8.5–10.3)
CHLORIDE: 106 mmol/L (ref 101–111)
CO2 TOTAL: 18 mmol/L — ABNORMAL LOW (ref 22–31)
CREATININE: 0.9 mg/dL (ref 0.50–1.20)
ESTIMATED GFR: 90 mL/min/1.73mˆ2 (ref >=60)
GLUCOSE: 109 mg/dL — ABNORMAL HIGH (ref 68–99)
POTASSIUM: 3.4 mmol/L — ABNORMAL LOW (ref 3.6–5.0)
PROTEIN TOTAL: 8.1 g/dL — ABNORMAL HIGH (ref 6.2–8.0)
SODIUM: 141 mmol/L (ref 137–145)

## 2018-03-02 LAB — HCG, URINE QUALITATIVE, PREGNANCY: HCG URINE QUALITATIVE: NOT DETECTED

## 2018-03-02 LAB — ECG 12 LEAD
Atrial Rate: 77 {beats}/min
Calculated P Axis: 73 degrees
Calculated R Axis: 79 degrees
PR Interval: 130 ms
QRS Duration: 84 ms
QT Interval: 404 ms
QTC Calculation: 457 ms
Ventricular rate: 77 {beats}/min

## 2018-03-02 LAB — TROPONIN-I: TROPONIN I: <0.01 ng/mL (ref 0.00–0.03)

## 2018-03-02 LAB — URINALYSIS, MICROSCOPIC: HYALINE CASTS: >100 /lpf — AB

## 2018-03-02 LAB — LIPASE: LIPASE: 30 U/L (ref 23–203)

## 2018-03-02 MED ORDER — ONDANSETRON HCL (PF) 4 MG/2 ML INJECTION SOLUTION
4.0000 mg | INTRAMUSCULAR | Status: AC
Start: 2018-03-02 — End: 2018-03-02
  Administered 2018-03-02: 4 mg via INTRAVENOUS
  Filled 2018-03-02: qty 2

## 2018-03-02 MED ORDER — SODIUM CHLORIDE 0.9 % (FLUSH) INJECTION SYRINGE
5.0000 mL | INJECTION | Freq: Two times a day (BID) | INTRAMUSCULAR | Status: DC
Start: 2018-03-02 — End: 2018-03-02

## 2018-03-02 MED ORDER — IOPAMIDOL 370 MG IODINE/ML (76 %) INTRAVENOUS SOLUTION
80.00 mL | INTRAVENOUS | Status: AC
Start: 2018-03-02 — End: 2018-03-02
  Administered 2018-03-02: 18:00:00 80 mL via INTRAVENOUS

## 2018-03-02 MED ORDER — SODIUM CHLORIDE 0.9 % (FLUSH) INJECTION SYRINGE
5.0000 mL | INJECTION | INTRAMUSCULAR | Status: DC | PRN
Start: 2018-03-02 — End: 2018-03-02

## 2018-03-02 MED ORDER — IBUPROFEN 600 MG TABLET: 600 mg | Tab | Freq: Three times a day (TID) | ORAL | 0 refills | 0 days | Status: AC | PRN

## 2018-03-02 MED ORDER — CEPHALEXIN 500 MG CAPSULE
500.00 mg | ORAL_CAPSULE | Freq: Three times a day (TID) | ORAL | 0 refills | Status: AC
Start: 2018-03-02 — End: 2018-03-09

## 2018-03-02 MED ORDER — MORPHINE 2 MG/ML INTRAVENOUS SYRINGE
2.0000 mg | INJECTION | INTRAVENOUS | Status: DC
Start: 2018-03-02 — End: 2018-03-02

## 2018-03-02 MED ORDER — SODIUM CHLORIDE 0.9 % IV BOLUS
1000.0000 mL | INJECTION | Status: AC
Start: 2018-03-02 — End: 2018-03-02
  Administered 2018-03-02: 0 mL via INTRAVENOUS
  Administered 2018-03-02: 1000 mL via INTRAVENOUS

## 2018-03-02 MED ORDER — CEFTRIAXONE 1 GRAM/50 ML IN DEXTROSE (ISO-OSMOT) INTRAVENOUS PIGGYBACK
1.0000 g | INJECTION | INTRAVENOUS | Status: DC
Start: 2018-03-02 — End: 2018-03-02
  Administered 2018-03-02: 0 g via INTRAVENOUS
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
## 2018-03-02 NOTE — ED Nurses Note (Signed)
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
## 2018-03-02 NOTE — Discharge Instructions (Signed)
Please return if worsening.  Please make follow up appointment.

## 2018-03-04 LAB — URINE CULTURE: URINE CULTURE: >100000

## 2018-03-05 LAB — CARBOXY - TETRAHYDROCANNABINOL (THC) CONFIRMATION, URINE
INTERPRETATION: POSITIVE
THC CARBOXYLIC ACID-BY GC/MS: 178 ng/mL

## 2018-03-06 LAB — AMPHETAMINES, CONFIRMATION, URINE
AMPHETAMINE-BY GC/MS: 2877 ng/mL
AMPHETAMINE-BY GC/MS: 2877 ng/mL
INTERPRETATION: POSITIVE
MDA (ECSTASY METABOLITE)-BY GC/MS: NEGATIVE ng/mL
MDMA (ECSTASY)-BY GC/MS: NEGATIVE ng/mL
METHAMPHETAMINE-BY GC/MS: 26309 ng/mL
PHENTERMINE-BY GC/MS: NEGATIVE ng/mL
PSEUDOEPHEDRINE/EPHEDRINE-BY GC/MS: NEGATIVE ng/mL

## 2019-09-27 ENCOUNTER — Emergency Department (HOSPITAL_COMMUNITY)
Admission: EM | Admit: 2019-09-27 | Discharge: 2019-09-27 | Disposition: A | Discharge status: Home / Self Care | Payer: Self-pay | Attending: Emergency Medicine | Admitting: Emergency Medicine

## 2019-09-27 ENCOUNTER — Encounter (HOSPITAL_COMMUNITY): Payer: Self-pay | Admitting: Emergency Medicine

## 2019-09-27 ENCOUNTER — Other Ambulatory Visit: Payer: Self-pay

## 2019-09-27 DIAGNOSIS — R109 Unspecified abdominal pain: Secondary | ICD-10-CM | POA: Insufficient documentation

## 2019-09-27 DIAGNOSIS — Z5321 Procedure and treatment not carried out due to patient leaving prior to being seen by health care provider: Secondary | ICD-10-CM | POA: Insufficient documentation

## 2019-09-27 LAB — COMPREHENSIVE METABOLIC PANEL
ALT: 11 U/L (ref 0–44)
AST: 15 U/L (ref 15–41)
Albumin: 3.9 g/dL (ref 3.5–5.0)
Alkaline Phosphatase: 63 U/L (ref 38–126)
Anion gap: 9 (ref 5–15)
BUN: 9 mg/dL (ref 6–20)
CO2: 25 mmol/L (ref 22–32)
Calcium: 9.2 mg/dL (ref 8.9–10.3)
Chloride: 104 mmol/L (ref 98–111)
Creatinine, Ser: 0.59 mg/dL (ref 0.44–1.00)
GFR calc Af Amer: >60 mL/min (ref >60)
GFR calc non Af Amer: >60 mL/min (ref >60)
Glucose, Bld: 107 mg/dL — ABNORMAL HIGH (ref 70–99)
Potassium: 3.7 mmol/L (ref 3.5–5.1)
Sodium: 138 mmol/L (ref 135–145)
Total Bilirubin: 0.6 mg/dL (ref 0.3–1.2)
Total Protein: 6.7 g/dL (ref 6.5–8.1)

## 2019-09-27 LAB — URINALYSIS, ROUTINE W REFLEX MICROSCOPIC
Bacteria, UA: NONE SEEN
Bilirubin Urine: NEGATIVE
Glucose, UA: NEGATIVE mg/dL
Hgb urine dipstick: NEGATIVE
Ketones, ur: 5 mg/dL — AB
Leukocytes,Ua: NEGATIVE
Nitrite: NEGATIVE
Protein, ur: NEGATIVE mg/dL
Specific Gravity, Urine: 1.025 (ref 1.005–1.030)
pH: 5 (ref 5.0–8.0)

## 2019-09-27 LAB — CBC
HCT: 34.2 % — ABNORMAL LOW (ref 36.0–46.0)
Hemoglobin: 10.9 g/dL — ABNORMAL LOW (ref 12.0–15.0)
MCH: 27.9 pg (ref 26.0–34.0)
MCHC: 31.9 g/dL (ref 30.0–36.0)
MCV: 87.7 fL (ref 80.0–100.0)
Platelets: 339 10*3/uL (ref 150–400)
RBC: 3.9 MIL/uL (ref 3.87–5.11)
RDW: 18.6 % — ABNORMAL HIGH (ref 11.5–15.5)
WBC: 6.2 10*3/uL (ref 4.0–10.5)
nRBC: 0 % (ref 0.0–0.2)

## 2019-09-27 LAB — LIPASE, BLOOD: Lipase: 19 U/L (ref 11–51)

## 2019-09-27 LAB — I-STAT BETA HCG BLOOD, ED (MC, WL, AP ONLY): I-stat hCG, quantitative: <5 m[IU]/mL (ref <5)

## 2019-09-27 NOTE — ED Notes (Signed)
Pt states she is leaving and going to urgent care.

## 2019-09-27 NOTE — ED Triage Notes (Signed)
Pt sts her menstrual cycle is two months late. Had a tubal ligation 3 years ago. Endorses abdominal cramping and back pain.
